# Patient Record
Sex: Female | Born: 1970
Health system: Southern US, Community
[De-identification: ages and names within clinical notes are randomized; demographics above are authoritative.]

## PROBLEM LIST (undated history)

## (undated) DIAGNOSIS — I10 Essential (primary) hypertension: Secondary | ICD-10-CM

## (undated) DIAGNOSIS — M940 Chondrocostal junction syndrome [Tietze]: Secondary | ICD-10-CM

## (undated) DIAGNOSIS — R079 Chest pain, unspecified: Secondary | ICD-10-CM

## (undated) HISTORY — DX: Chest pain, unspecified: R07.9

## (undated) HISTORY — DX: Chondrocostal junction syndrome (tietze): M94.0

---

## 1999-04-26 ENCOUNTER — Other Ambulatory Visit: Admission: RE | Admit: 1999-04-26 | Discharge: 1999-04-26 | Payer: Self-pay | Admitting: Gynecology

## 2001-05-28 ENCOUNTER — Emergency Department (HOSPITAL_COMMUNITY): Admission: EM | Admit: 2001-05-28 | Discharge: 2001-05-28 | Payer: Self-pay | Admitting: Emergency Medicine

## 2001-05-28 ENCOUNTER — Encounter: Payer: Self-pay | Admitting: Emergency Medicine

## 2004-07-07 ENCOUNTER — Emergency Department (HOSPITAL_COMMUNITY): Admission: EM | Admit: 2004-07-07 | Discharge: 2004-07-07 | Payer: Self-pay | Admitting: Emergency Medicine

## 2007-04-17 ENCOUNTER — Encounter (INDEPENDENT_AMBULATORY_CARE_PROVIDER_SITE_OTHER): Payer: Self-pay | Admitting: Obstetrics and Gynecology

## 2007-04-17 ENCOUNTER — Ambulatory Visit (HOSPITAL_COMMUNITY): Admission: RE | Admit: 2007-04-17 | Discharge: 2007-04-17 | Payer: Self-pay | Admitting: Obstetrics and Gynecology

## 2007-04-17 HISTORY — PX: DILATION AND CURETTAGE OF UTERUS: SHX78

## 2008-01-22 ENCOUNTER — Observation Stay (HOSPITAL_COMMUNITY): Admission: EM | Admit: 2008-01-22 | Discharge: 2008-01-23 | Payer: Self-pay | Admitting: Emergency Medicine

## 2008-01-22 ENCOUNTER — Encounter (INDEPENDENT_AMBULATORY_CARE_PROVIDER_SITE_OTHER): Payer: Self-pay | Admitting: General Surgery

## 2008-01-22 HISTORY — PX: LAPAROSCOPIC APPENDECTOMY: SHX408

## 2008-10-11 ENCOUNTER — Inpatient Hospital Stay (HOSPITAL_COMMUNITY): Admission: AD | Admit: 2008-10-11 | Discharge: 2008-10-11 | Payer: Self-pay | Admitting: Obstetrics and Gynecology

## 2008-12-01 ENCOUNTER — Inpatient Hospital Stay (HOSPITAL_COMMUNITY): Admission: AD | Admit: 2008-12-01 | Discharge: 2008-12-01 | Payer: Self-pay | Admitting: Obstetrics and Gynecology

## 2008-12-03 ENCOUNTER — Inpatient Hospital Stay (HOSPITAL_COMMUNITY): Admission: AD | Admit: 2008-12-03 | Discharge: 2008-12-03 | Payer: Self-pay | Admitting: Obstetrics and Gynecology

## 2009-01-23 ENCOUNTER — Inpatient Hospital Stay (HOSPITAL_COMMUNITY): Admission: AD | Admit: 2009-01-23 | Discharge: 2009-01-26 | Payer: Self-pay | Admitting: Obstetrics and Gynecology

## 2010-06-20 LAB — CBC
HCT: 32.6 % — ABNORMAL LOW (ref 36.0–46.0)
HCT: 33 % — ABNORMAL LOW (ref 36.0–46.0)
Hemoglobin: 10.2 g/dL — ABNORMAL LOW (ref 12.0–15.0)
Hemoglobin: 11 g/dL — ABNORMAL LOW (ref 12.0–15.0)
MCHC: 33.4 g/dL (ref 30.0–36.0)
MCV: 85.4 fL (ref 78.0–100.0)
Platelets: 268 10*3/uL (ref 150–400)
Platelets: 272 10*3/uL (ref 150–400)
RBC: 3.56 MIL/uL — ABNORMAL LOW (ref 3.87–5.11)
RBC: 3.86 MIL/uL — ABNORMAL LOW (ref 3.87–5.11)
RDW: 15.5 % (ref 11.5–15.5)
WBC: 10.5 10*3/uL (ref 4.0–10.5)
WBC: 11.3 10*3/uL — ABNORMAL HIGH (ref 4.0–10.5)

## 2010-06-20 LAB — COMPREHENSIVE METABOLIC PANEL
ALT: 15 U/L (ref 0–35)
AST: 22 U/L (ref 0–37)
Albumin: 2.9 g/dL — ABNORMAL LOW (ref 3.5–5.2)
Alkaline Phosphatase: 190 U/L — ABNORMAL HIGH (ref 39–117)
BUN: 3 mg/dL — ABNORMAL LOW (ref 6–23)
CO2: 19 mEq/L (ref 19–32)
Calcium: 8.7 mg/dL (ref 8.4–10.5)
Chloride: 100 mEq/L (ref 96–112)
Creatinine, Ser: 0.42 mg/dL (ref 0.4–1.2)
GFR calc Af Amer: >60 mL/min (ref >60)
GFR calc non Af Amer: >60 mL/min (ref >60)
Glucose, Bld: 89 mg/dL (ref 70–99)
Potassium: 3.7 mEq/L (ref 3.5–5.1)
Sodium: 129 mEq/L — ABNORMAL LOW (ref 135–145)
Total Bilirubin: 0.6 mg/dL (ref 0.3–1.2)
Total Protein: 6.4 g/dL (ref 6.0–8.3)

## 2010-06-20 LAB — RH IMMUNE GLOB WKUP(>/=20WKS)(NOT WOMEN'S HOSP): Fetal Screen: NEGATIVE

## 2010-06-20 LAB — RPR: RPR Ser Ql: NONREACTIVE

## 2010-06-20 LAB — LACTATE DEHYDROGENASE: LDH: 129 U/L (ref 94–250)

## 2010-06-20 LAB — URIC ACID: Uric Acid, Serum: 3.4 mg/dL (ref 2.4–7.0)

## 2010-06-22 LAB — CREATININE, URINE, 24 HOUR
Collection Interval-UCRE24: 24 hours
Creatinine, 24H Ur: 1754 mg/d (ref 700–1800)
Creatinine, Urine: 81.2 mg/dL

## 2010-06-22 LAB — COMPREHENSIVE METABOLIC PANEL
ALT: 15 U/L (ref 0–35)
Albumin: 3.1 g/dL — ABNORMAL LOW (ref 3.5–5.2)
Alkaline Phosphatase: 90 U/L (ref 39–117)
Potassium: 3.5 mEq/L (ref 3.5–5.1)
Sodium: 135 mEq/L (ref 135–145)
Total Protein: 6.6 g/dL (ref 6.0–8.3)

## 2010-06-22 LAB — CBC
Platelets: 306 10*3/uL (ref 150–400)
RDW: 14 % (ref 11.5–15.5)

## 2010-06-22 LAB — PROTEIN, URINE, 24 HOUR
Collection Interval-UPROT: 24 hours
Protein, Urine: 4 mg/dL

## 2010-06-22 LAB — URIC ACID: Uric Acid, Serum: 3.2 mg/dL (ref 2.4–7.0)

## 2010-06-24 LAB — RH IMMUNE GLOBULIN WORKUP (NOT WOMEN'S HOSP)

## 2010-07-31 NOTE — H&P (Signed)
NAME:  KARN, Debbie Shields             ACCOUNT NO.:  [id number]   MEDICAL RECORD NO.:  [id number]          PATIENT TYPE:  INP   LOCATION:  2550                         FACILITY:  [facility]   PHYSICIAN:  Ollen Gross. Vernell Morgans, M.D. DATE OF BIRTH:  Aug 10, 1970   DATE OF ADMISSION:  01/22/2008  DATE OF DISCHARGE:                              HISTORY & PHYSICAL   ADMITTING PHYSICIAN:  Ollen Gross. Vernell Morgans, M.D.   REASON FOR ADMISSION:  Acute appendicitis.   HISTORY OF PRESENT ILLNESS:  Debbie Shields is a 40 year old black female  who presents with right lower quadrant abdominal pain that began last  night.  She states the last time, it was approximately 8 p.m. last  night.  She has had some associated nausea and vomiting with her  abdominal pain.  She states that her pain started around her umbilicus  and as it progressed it moved to her right lower quadrant.  Otherwise at  this time, she has not had any fevers at home.  Due to this pain, she  presented to the emergency department this morning.  While here, she had  a CT scan, which showed acute appendicitis.  She does have a white blood  cell count of 14,500 with left shift and neutrophils 91%.  At this time,  we were called down to see the patient.   REVIEW OF SYSTEMS:  Please see HPI, otherwise all other systems are  negative.   FAMILY HISTORY:  Noncontributory.   PAST MEDICAL HISTORY:  None.   PAST SURGICAL HISTORY:  Status post D and C.   SOCIAL HISTORY:  I believe, the patient is single.  She is a nonsmoker  and a nondrinker.   ALLERGIES:  NKDA.   MEDICATIONS:  None.   PHYSICAL EXAMINATION:  GENERAL:  Debbie Shields is a 40 year old black  female who is currently lying in bed, in no acute distress.  VITAL SIGNS:  Temp 98.1, pulse 100, respirations 16, and blood pressure  128/78.  HEENT:  Eyes, sclerae noninjected.  Pupils were equal, round, and  reactive to light.  Ears, nose, mouth, and throat; ears and nose with no  obvious masses or  lesions.  No rhinorrhea.  Mouth is pink, albeit  somewhat dry.  Throat shows no exudate.  NECK:  Supple.  Trachea is midline.  No thyromegaly.  HEART:  Regular rhythm albeit somewhat tachycardic.  No murmurs,  gallops, or rubs are noted.  +2 carotid, radial, and pedal pulses  bilaterally.  LUNGS:  Clear to auscultation bilaterally.  No wheezes, rhonchi, or  rales noted.  Respiratory effort is nonlabored.  ABDOMEN:  Soft and very tender in the right lower quadrant.  She does  display some guarding.  Otherwise, she has hypoactive bowel sounds.  She  is nondistended, and mildly overweight.  Otherwise, she does not have  any obvious masses or hernias.  MUSCULOSKELETAL:  All 4 extremities are symmetrical with no cyanosis,  clubbing, or edema.  SKIN:  Warm and dry.  NEURO:  Cranial nerves II through XII appeared to be grossly intact.  PSYCH:  The patient is alert,  and oriented with an appropriate affect.   LABORATORY DATA AND DIAGNOSTICS:  White blood cell count is 14,500,  hemoglobin 13, hematocrit 38.8, platelet count is 321,000, and  neutrophil count at 91%.  Sodium 133, potassium 4.0, glucose 137, BUN 9,  creatinine 0.63.  LFTs are normal.  CT of the abdomen and pelvis shows  acute appendicitis.   IMPRESSION:  1. Acute appendicitis.  2. Dehydration.   PLAN:  At this time, we will plan on admitting the patient for a  laparoscopic appendectomy today.  She will be given various p.r.n.  medications for pain and nausea as well as Invanz.  First dose on-call  to the OR.  Otherwise, she will be kept n.p.o. and IV fluids will be  started and SCDs will be placed on call from the OR.  Dr. Carolynne Edouard has  discussed the risks and benefits of the procedure with the patient as  well as her parents who are currently in the room.  The patient is in  agreement and at this time wishes to proceed with surgical intervention.      Letha Cape, PA      Ollen Gross. Vernell Morgans, M.D.  Electronically  Signed    KEO/MEDQ  D:  01/22/2008  T:  01/22/2008  Job:  [id number]

## 2010-07-31 NOTE — Op Note (Signed)
NAME:  Debbie Shields, Debbie Shields             ACCOUNT NO.:  [id number]   MEDICAL RECORD NO.:  [id number]          PATIENT TYPE:  AMB   LOCATION:  SDC                           FACILITY:  [facility]   PHYSICIAN:  Hal Morales, M.D.DATE OF BIRTH:  [birth year]   DATE OF PROCEDURE:  04/17/2007  DATE OF DISCHARGE:                               OPERATIVE REPORT   PREOPERATIVE DIAGNOSIS:  Missed abortion at [redacted] weeks gestation.   POSTOPERATIVE DIAGNOSIS:  Missed abortion at [redacted] weeks gestation.   OPERATION:  Suction dilatation and evacuation.   SURGEON:  Hal Morales, M.D.   ANESTHESIA:  General mask.   ESTIMATED BLOOD LOSS:  Less than 10 mL.   COMPLICATIONS:  None.   FINDINGS:  The uterus sounded to 10 cm and contained a moderate amount  of products of conception.  The products of conception went to  pathology.   PROCEDURE:  The patient is taken to the operating room and after  appropriate identification, placed on the operating table.  After the  attainment of adequate general anesthesia, she was placed in the  lithotomy position.  The perineum and vagina were prepped with multiple  layers of Betadine and draped as a sterile field.  The bladder was  emptied with an in-and-out catheter in a sterile fashion.  The Graves  speculum was placed in the vagina and a paracervical block achieved with  a total of 10 mL of 2% Xylocaine in the 5 and 7 o'clock positions.  The  single-tooth tenaculum was placed on the anterior cervix.  The uterus  was sounded.  The cervix was dilated to accommodate a #7 suction  catheter and this was used to suction evacuate all quadrants of the  uterus.  Uterus was sharp curetted to ensure that all products of  conception had been removed.  All instruments were removed from the  vagina once hemostasis was noted to be adequate and the patient awakened  from general anesthesia and taken to the recovery room in satisfactory  condition, having tolerated the procedure well  with sponge and  instrument counts correct.   She was given Methergine 0.2 mg IM, Toradol 30 mg IV and 30 mg IM.   DISCHARGE INSTRUCTIONS:  Printed instructions from [facility] for  [facility].   DISCHARGE MEDICATIONS:  1. Ibuprofen 600 mg over-the-counter p.o. q.6 h p.r.n. pain,.  2. Methergine 0.2 mg p.o. q.6 h for eight doses.  The next dose is at      3:00 p.m. on April 17, 2007.  3. Doxycycline 100 mg p.o. b.i.d. for 5 days.   FOLLOW UP:  The patient is to follow-up in 2 weeks at [facility], a [facility] of [facility].  Blood type is B  negative.  The patient received RhoGAM before discharge.      Hal Morales, M.D.  Electronically Signed     VPH/MEDQ  D:  04/17/2007  T:  04/17/2007  Job:  [id number]

## 2010-07-31 NOTE — Op Note (Signed)
NAME:  Debbie Shields, Debbie Shields             ACCOUNT NO.:  [id number]   MEDICAL RECORD NO.:  [id number]          PATIENT TYPE:  INP   LOCATION:  2550                         FACILITY:  [facility]   PHYSICIAN:  Ollen Gross. Vernell Morgans, M.D. DATE OF BIRTH:  Oct 05, 1970   DATE OF PROCEDURE:  01/22/2008  DATE OF DISCHARGE:                               OPERATIVE REPORT   PREOPERATIVE DIAGNOSIS:  Acute appendicitis.   POSTOPERATIVE DIAGNOSIS:  Acute appendicitis.   PROCEDURE:  Laparoscopic appendectomy.   SURGEON:  Ollen Gross. Vernell Morgans, MD   ANESTHESIA:  General endotracheal.   PROCEDURE IN DETAIL:  After informed consent was obtained, the patient  was brought to the operating room and placed in a supine position on the  operating room table.  After adequate induction of general anesthesia,  the patient's abdomen was prepped with Betadine and draped in usual  sterile manner.  The area below the umbilicus was infiltrated with 0.25%  Marcaine.  A small incision was made with a 15 blade knife.  This  incision was carried down through the subcutaneous tissue bluntly with a  hemostat and [employer] retractors until the linea alba was identified.  The linea alba was incised with a 15 blade knife and each side was  grasped with Kocher clamps and elevated anteriorly.  The preperitoneal  space was then probed bluntly with a hemostat until the perineum was  opened and access was gained to the abdominal cavity.  A 0-Vicryl purse-  string stitch was placed in the fascia around the opening.  A Hasson  cannula was placed through the opening and anchored in place with  previously placed Vicryl purse-string stitch.  The abdomen was then  insufflated with carbon dioxide without difficulty.  The patient was  placed in Trendelenburg position and rotated with the right side up.  Next, the suprapubic region was infiltrated with 0.25% Marcaine.  A  small incision was made with a 15 blade knife and a 11-mm port was  placed bluntly  through this incision into the abdominal cavity under  direct vision.  A site was chosen between the two for placement of a 5-  mm port.  This area was infiltrated with 0.25% Marcaine.  A small stab  incision was made with a 15 blade knife and a 5-mm port was placed  bluntly through this incision into the abdominal cavity under direct  vision.  The laparoscope was then removed through the suprapubic port  and right lower quadrant was inspected.  The enlarged and inflamed  appendix was readily identified.  Using a Glassman grasper and harmonic  scalpel through the other two ports, the appendix was able to be  elevated.  The mesoappendix was taken down sharply with the harmonic  scalpel.  Once the base of the appendix at its junction with the cecum  was identified and cleared of any other tissue, a laparoscopic 45-mm  blue load 6-row stapler was placed through the Hasson cannula across the  base of the appendix at its junction with the cecum clamped and fired  thereby dividing the base of the appendix between the  staple lines.  Laparoscopic bag was inserted through the Hasson cannula and the  appendix was placed in the bag and the bag was sealed.  The area was  then irrigated with copious amounts of saline until the effluent was  clear.  The staple line was examined and was oozing a small amount of  blood.  This was controlled with some laparoscopic clips and at this  point everything looked hemostatic and the staple line was intact.  The  appendix and bag were then removed through with the port through the  infraumbilical incision without any difficulty.  The fascial defect was  closed with previously placed Vicryl purse-string stitch as well as  another figure-of-eight 0 Vicryl stitch.  The rest of ports were removed  under direct vision and were found to be hemostatic.  The gas was  allowed to escape.  The skin incisions were all  closed with interrupted 4-0 Monocryl subcuticular  stitches.  Dermabond  dressings were applied.  The patient tolerated the procedure well.  At  the end of the case, all needle, sponge, and instrument counts were  correct.  The patient was then awakened and taken to recovery in stable  condition.      Ollen Gross. Vernell Morgans, M.D.  Electronically Signed     PST/MEDQ  D:  01/22/2008  T:  01/22/2008  Job:  [id number]

## 2010-07-31 NOTE — H&P (Signed)
NAME:  Debbie Shields, SIVERTSON             ACCOUNT NO.:  [id number]   MEDICAL RECORD NO.:  [id number]          PATIENT TYPE:  AMB   LOCATION:  SDC                           FACILITY:  [facility]   PHYSICIAN:  Hal Morales, M.D.DATE OF BIRTH:  1970-12-04   DATE OF ADMISSION:  04/17/2007  DATE OF DISCHARGE:                              HISTORY & PHYSICAL   Debbie Shields is a 40 year old gravida 2, para 0-0-1-0 at 9 weeks, 4 days  by LMP but 6 weeks, 4 days by ultrasound with an intrauterine fetal  demise noted and an enlarged yolk sac noted.  Patient had presented for  her new OB visit on January 29 without any history of cramping,  bleeding, or any other issues.  She had her new GYN exam on March 30, 2007, at which time she had her Pap, and she was noted to be in the  first trimester of pregnancy at that time.  She was scheduled then for a  new OB workup today and an ultrasound for dating purposes.   HISTORY:  1. Patient was going to be advanced maternal age at the time of her      delivery.  2. History of mild hypertension.   LABS:  Blood type is B-, per [employer] card.  GC and Chlamydia  cultures were negative on January 12.  Patient's Pap smear done that  same day showed atypical cells of undetermined significance with  negative high resolution HPV and coccobacilli findings.  No other  prenatal labs were done on the patient's visit of January 29.   CURRENT OBSTETRICAL HISTORY:  Patient had a last menstrual period on  February 08, 2007.  This was a slightly short cycle.  That is why she  was scheduled for ultrasound today.  Her original [facility] by LMP was given  to be November 17, 2007.  However, on ultrasound on January 29, she was  noted to have a 6 week, 4 day fetal pole with no fetal heart tones.  Absent fetal heart rate was documented by M-mode, and her yolk sac was  enlarged.  Patient was then counseled regarding her options of  observation, medical management of missed AB,  or dilatation/evacuation.  Patient did wish to proceed with D&E.  Dr. Stefano Gaul was consulted, and  the decision was made to schedule her on April 17, 2007 with Dr.  Pennie Rushing at 3 p.m.   OBSTETRICAL HISTORY:  In 1997, patient had a first trimester termination  of pregnancy.   PAST MEDICAL HISTORY:  She is a previous condom and withdrawal user for  contraception.  In 1994, she had a questionable abnormal Pap.  Followup  was normal.  In 2005, she had a normal Pap at Dr. Ebony Hail office.  In  2006, she had a low grade SIL with mild dysplasia.  Colposcopy was  planned.  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  In March, 2007, she had a  Pap showing negative for intraepithelial lesion.  Trichomonas was  present.  This was treated.  She had another Pap in July, 2007 which  showed normal findings.  That was the last Pap that I have from that  office.   PAST SURGICAL HISTORY:  Previously noted termination of pregnancy in  1997.  Wisdom teeth removed in the past.  She reports the usual  childhood illnesses.  She did have Trichomonas several years ago.  She  also reports mild hypertension in the past.  She was on HCTZ but none  has been used in recent years.   FAMILY HISTORY:  Her sister had cervical cancer at age 74.   GENETIC HISTORY:  Remarkable for the patient being 36 at this time, but  she was going to be 37 at the time of delivery.   SOCIAL HISTORY:  Patient is engaged.  The father of the pregnancy was  involved and supportive.  His name is Raye Sorrow.  Patient is biracial.  She declares no specific religion.  She is college-educated.  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  She was going to be followed by the  certified nurse midwife service at [facility].   PHYSICAL EXAMINATION:  VITAL SIGNS:  Stable.  Patient is afebrile.  HEENT:  Within normal limits.  LUNGS:  Bilateral breath sounds are  clear.  HEART:  Regular rate and rhythm without murmur.  BREASTS:  Soft and nontender.  ABDOMEN:  Soft and nontender.  PELVIC:  Normal at her GYN appointment.  There was no evidence of  bleeding.  EXTREMITIES:  Deep tendon reflexes are 2+ without clonus.  There is  trace edema noted.   IMPRESSION:  1. A 6 week, 4 day fetal demise with a missed abortion.  2. B- blood type, per [employer].  3. Advanced maternal age.  4. History of mild hypertension but normal blood pressures today.   PLAN:  1. Admit to [facility] preoperatively on April 17, 2007 for D&E      by Dr. Pennie Rushing.  2. Routine preoperative orders.  3. Rophylac will be given secondary to B- status if antibody screen is      negative.  4. Support will be provided to the patient for her loss.  5. Pap will be reviewed subsequent to the patient's procedure.      Renaldo Reel Emilee Hero, C.N.M.      Hal Morales, M.D.  Electronically Signed    VLL/MEDQ  D:  04/16/2007  T:  04/16/2007  Job:  [id number]

## 2010-12-06 LAB — RH IMMUNE GLOBULIN WORKUP (NOT WOMEN'S HOSP)
ABO/RH(D): B NEG
Antibody Screen: NEGATIVE

## 2010-12-06 LAB — CBC
HCT: 33.9 — ABNORMAL LOW
Platelets: 318
WBC: 6.9

## 2010-12-06 LAB — ABO/RH: ABO/RH(D): B NEG

## 2010-12-19 LAB — COMPREHENSIVE METABOLIC PANEL
ALT: 42 — ABNORMAL HIGH
Albumin: 4.2
Alkaline Phosphatase: 56
Calcium: 9.3
Potassium: 4
Sodium: 133 — ABNORMAL LOW
Total Protein: 7.5

## 2010-12-19 LAB — DIFFERENTIAL
Basophils Relative: 0
Eosinophils Absolute: 0
Lymphs Abs: 0.9
Monocytes Absolute: 0.4
Monocytes Relative: 3
Neutro Abs: 13.2 — ABNORMAL HIGH
Neutrophils Relative %: 91 — ABNORMAL HIGH

## 2010-12-19 LAB — URINALYSIS, ROUTINE W REFLEX MICROSCOPIC
Bilirubin Urine: NEGATIVE
Glucose, UA: NEGATIVE
Hgb urine dipstick: NEGATIVE
Ketones, ur: 15 — AB
Specific Gravity, Urine: 1.028
pH: 6.5

## 2010-12-19 LAB — PROTIME-INR
INR: 1
Prothrombin Time: 12.9

## 2010-12-19 LAB — CBC
MCHC: 33.4
Platelets: 321
RDW: 14

## 2010-12-19 LAB — WET PREP, GENITAL
Trich, Wet Prep: NONE SEEN
Yeast Wet Prep HPF POC: NONE SEEN

## 2010-12-19 LAB — GC/CHLAMYDIA PROBE AMP, GENITAL
Chlamydia, DNA Probe: NEGATIVE
GC Probe Amp, Genital: NEGATIVE

## 2010-12-19 LAB — APTT: aPTT: 28

## 2012-02-17 ENCOUNTER — Encounter (HOSPITAL_COMMUNITY): Payer: Self-pay

## 2012-02-17 ENCOUNTER — Emergency Department (HOSPITAL_COMMUNITY)
Admission: EM | Admit: 2012-02-17 | Discharge: 2012-02-17 | Disposition: A | Discharge status: Home / Self Care | Payer: PRIVATE HEALTH INSURANCE | Source: Home / Self Care

## 2012-02-17 DIAGNOSIS — R0789 Other chest pain: Secondary | ICD-10-CM

## 2012-02-17 DIAGNOSIS — R071 Chest pain on breathing: Secondary | ICD-10-CM

## 2012-02-17 DIAGNOSIS — M940 Chondrocostal junction syndrome [Tietze]: Secondary | ICD-10-CM

## 2012-02-17 HISTORY — DX: Essential (primary) hypertension: I10

## 2012-02-17 MED ORDER — KETOROLAC TROMETHAMINE 60 MG/2ML IM SOLN
INTRAMUSCULAR | Status: AC
  Filled 2012-02-17: qty 2

## 2012-02-17 MED ORDER — KETOROLAC TROMETHAMINE 60 MG/2ML IM SOLN
60.0000 mg | Freq: Once | INTRAMUSCULAR | Status: AC
  Administered 2012-02-17: 60 mg via INTRAMUSCULAR

## 2012-02-17 MED ORDER — NAPROXEN 500 MG PO TBEC: 500.0000 mg | DELAYED_RELEASE_TABLET | Freq: Two times a day (BID) | ORAL | Status: DC

## 2012-02-17 NOTE — ED Provider Notes (Signed)
History     CSN: [id number]  Arrival date & time 02/17/12  1518   None     Chief Complaint  Patient presents with  . Chest Pain    (Consider location/radiation/quality/duration/timing/severity/associated sxs/prior treatment) HPI Comments: 41 year old obese female presents with chest pain since yesterday. The pain is located retro-left breast area as well as the left parasternal and upper costal margin. The pain is elicited by certain movement in body positions as well is taking a deep breath. She describes it as sharp, shooting as well as burning sensation. She denies heaviness, tightness, fullness, pressure, squeezing. No associated symptoms such as nausea, vomiting, diaphoresis, malaise or weakness. At rest in while and mobile there is no discomfort. She has no personal history of heart disease.   Past Medical History  Diagnosis Date  . Hypertension     History reviewed. No pertinent past surgical history.  History reviewed. No pertinent family history.  History  Substance Use Topics  . Smoking status: Never Smoker   . Smokeless tobacco: Not on file  . Alcohol Use: No    OB History    Grav Para Term Preterm Abortions TAB SAB Ect Mult Living                  Review of Systems  Constitutional: Negative for fever, activity change and fatigue.  HENT: Negative.   Respiratory: Negative for cough, shortness of breath and wheezing.   Cardiovascular: Positive for chest pain. Negative for palpitations.  Gastrointestinal: Negative.   Genitourinary: Negative.   Musculoskeletal: Negative.   Skin: Negative for color change, pallor and rash.  Neurological: Negative.   Psychiatric/Behavioral: The patient is nervous/anxious.     Allergies  Review of patient's allergies indicates no known allergies.  Home Medications   Current Outpatient Rx  Name  Route  Sig  Dispense  Refill  . NAPROXEN 500 MG PO TBEC   Oral   Take 1 tablet (500 mg total) by mouth 2 (two) times daily  with a meal.   20 tablet   0     BP 141/89  Pulse 87  Temp 97.9 F (36.6 C) (Oral)  Resp 20  SpO2 100%  LMP 02/16/2012  Physical Exam  Nursing note and vitals reviewed. Constitutional: She is oriented to person, place, and time. She appears well-developed and well-nourished. No distress.  HENT:  Head: Normocephalic and atraumatic.  Eyes: EOM are normal. Pupils are equal, round, and reactive to light.  Neck: Normal range of motion. Neck supple.  Cardiovascular: Normal rate, regular rhythm, normal heart sounds and intact distal pulses.   No murmur heard. Pulmonary/Chest: Effort normal and breath sounds normal. No respiratory distress. She has no wheezes. She has no rales. She exhibits tenderness.       Reproducible tenderness with manual pressure over the left sternal border and retro-left breast chest wall.  Musculoskeletal: She exhibits no edema and no tenderness.  Lymphadenopathy:    She has no cervical adenopathy.  Neurological: She is alert and oriented to person, place, and time. No cranial nerve deficit.  Skin: Skin is warm and dry.    ED Course  Procedures (including critical care time)  Labs Reviewed - No data to display No results found.   1. Costochondritis, acute   2. Chest wall pain       MDM  Reassurance Naprosyn EC 500 mg one twice a day with food when necessary pain Ice to sore areas This may last a few days to  a few weeks for any worsening new symptoms or problems recheck with your physician or may return  EKG normal sinus rhythm, no ischemic changes or ectopy.        Hayden Rasmussen, NP 02/17/12 1700  Hayden Rasmussen, NP 02/17/12 2041

## 2012-02-17 NOTE — ED Notes (Signed)
Asked by front staff to assess pt for chest pain... Pt c/o chest pain since yesterday... Pain increases w/activity... Denies: SOB, headaches, blurry vision, edema, fevers, vomiting, nauseas, diarrhea... Was dx w/HTN but not taking any meds for HTN... Pt is alert w/no signs of acute distress and asked to wait in the lobby since the wait is not long.

## 2012-02-17 NOTE — ED Notes (Signed)
C/o pain in left chest under breast since yesterday; pain comes and goes , worse this PM; father ( early 15's ) has just had a 4 vessel bypass, pt has hist of HBP. . Pain worse w palpation and w deep breaths

## 2012-02-18 NOTE — ED Provider Notes (Signed)
Medical screening examination/treatment/procedure(s) were performed by resident physician or non-physician practitioner and as supervising physician I was immediately available for consultation/collaboration.   Evalee Gerard DOUGLAS MD.    Meghna Hagmann D Trany Chernick, MD 02/18/12 1740 

## 2012-03-02 ENCOUNTER — Encounter: Payer: Self-pay | Admitting: *Deleted

## 2012-03-03 ENCOUNTER — Encounter: Payer: Self-pay | Admitting: *Deleted

## 2012-03-03 ENCOUNTER — Ambulatory Visit (INDEPENDENT_AMBULATORY_CARE_PROVIDER_SITE_OTHER): Payer: PRIVATE HEALTH INSURANCE | Admitting: Cardiovascular Disease

## 2012-03-03 VITALS — BP 155/90 | HR 107 | Ht 70.0 in | Wt 216.8 lb

## 2012-03-03 DIAGNOSIS — R079 Chest pain, unspecified: Secondary | ICD-10-CM | POA: Insufficient documentation

## 2012-03-03 DIAGNOSIS — R0989 Other specified symptoms and signs involving the circulatory and respiratory systems: Secondary | ICD-10-CM

## 2012-03-03 DIAGNOSIS — I10 Essential (primary) hypertension: Secondary | ICD-10-CM | POA: Insufficient documentation

## 2012-03-03 DIAGNOSIS — R0609 Other forms of dyspnea: Secondary | ICD-10-CM

## 2012-03-03 DIAGNOSIS — R0600 Dyspnea, unspecified: Secondary | ICD-10-CM

## 2012-03-03 MED ORDER — LOSARTAN POTASSIUM 50 MG PO TABS: 50.0000 mg | ORAL_TABLET | Freq: Every day | ORAL | Status: DC

## 2012-03-03 NOTE — Assessment & Plan Note (Signed)
Stop diuretic and start Cozaar  Low sodium diet and weight loss

## 2012-03-03 NOTE — Progress Notes (Signed)
Patient ID: Debbie Shields, female   DOB: May 18, 1970, 41 y.o.   MRN: [id number] 40 yo seen in ER 12/2  Presented with chest pain 12/1. The pain is located retro-left breast area as well as the left parasternal and upper costal margin. The pain is elicited by certain movement in body positions as well is taking a deep breath. She describes it as sharp, shooting as well as burning sensation. She denies heaviness, tightness, fullness, pressure, squeezing. No associated symptoms such as  nausea, vomiting, diaphoresis, malaise or weakness. At rest in while and mobile there is no discomfort. She has no personal history of heart disease.  ROS: Denies fever, malais, weight loss, blurry vision, decreased visual acuity, cough, sputum, SOB, hemoptysis, pleuritic pain, palpitaitons, heartburn, abdominal pain, melena, lower extremity edema, claudication, or rash.  All other systems reviewed and negative   General: Affect appropriate Healthy:  appears stated age HEENT: normal Neck supple with no adenopathy JVP normal no bruits no thyromegaly Lungs clear with no wheezing and good diaphragmatic motion Heart:  S1/S2 no murmur,rub, gallop or click PMI normal Abdomen: benighn, BS positve, no tenderness, no AAA no bruit.  No HSM or HJR Distal pulses intact with no bruits No edema Neuro non-focal Skin warm and dry No muscular weakness  Medications Current Outpatient Prescriptions  Medication Sig Dispense Refill  . hydrochlorothiazide (HYDRODIURIL) 25 MG tablet Take 25 mg by mouth daily.        Allergies Review of patient's allergies indicates no known allergies.  Family History: Family History  Problem Relation Age of Onset  . Cervical cancer Sister 63  . Heart Problems Father     4 vessel CABG    Social History: History   Social History  . Marital Status: Single    Spouse Name: N/A    Number of Children: N/A  . Years of Education: N/A   Occupational History  . Not on file.    Social History Main Topics  . Smoking status: Never Smoker   . Smokeless tobacco: Not on file  . Alcohol Use: No  . Drug Use:   . Sexually Active:    Other Topics Concern  . Not on file   Social History Narrative  . No narrative on file    Electrocardiogram:  12/2  SR 80 normal  Assessment and Plan

## 2012-03-03 NOTE — Assessment & Plan Note (Signed)
Atypical normal ECG  F/U ETT and echo

## 2012-03-03 NOTE — Patient Instructions (Addendum)
Your physician recommends that you schedule a follow-up appointment in: AS NEEDED Your physician has recommended you make the following change in your medication:  STOP HCTZ START  LOSARTAN 50 MG  EVERYDAY   Your physician has requested that you have an exercise tolerance test. For further information please visit [email or web address] Please also follow instruction sheet, as given. DX CHEST PAIN  Your physician has requested that you have an echocardiogram. Echocardiography is a painless test that uses sound waves to create images of your heart. It provides your doctor with information about the size and shape of your heart and how well your heart's chambers and valves are working. This procedure takes approximately one hour. There are no restrictions for this procedure. DX CHEST PAIN

## 2012-03-30 ENCOUNTER — Ambulatory Visit (HOSPITAL_COMMUNITY): Payer: PRIVATE HEALTH INSURANCE | Attending: Cardiovascular Disease

## 2012-03-30 DIAGNOSIS — R079 Chest pain, unspecified: Secondary | ICD-10-CM

## 2012-03-30 DIAGNOSIS — I369 Nonrheumatic tricuspid valve disorder, unspecified: Secondary | ICD-10-CM | POA: Insufficient documentation

## 2012-03-30 DIAGNOSIS — R0600 Dyspnea, unspecified: Secondary | ICD-10-CM

## 2012-03-30 DIAGNOSIS — I10 Essential (primary) hypertension: Secondary | ICD-10-CM | POA: Insufficient documentation

## 2012-03-30 DIAGNOSIS — R072 Precordial pain: Secondary | ICD-10-CM | POA: Insufficient documentation

## 2012-03-30 DIAGNOSIS — I059 Rheumatic mitral valve disease, unspecified: Secondary | ICD-10-CM | POA: Insufficient documentation

## 2012-03-30 NOTE — Progress Notes (Signed)
Echocardiogram performed.  

## 2012-04-02 ENCOUNTER — Encounter: Payer: PRIVATE HEALTH INSURANCE | Admitting: Physician Assistant

## 2012-04-27 ENCOUNTER — Ambulatory Visit (INDEPENDENT_AMBULATORY_CARE_PROVIDER_SITE_OTHER): Payer: PRIVATE HEALTH INSURANCE | Admitting: Physician Assistant

## 2012-04-27 DIAGNOSIS — R079 Chest pain, unspecified: Secondary | ICD-10-CM

## 2012-04-27 DIAGNOSIS — R0600 Dyspnea, unspecified: Secondary | ICD-10-CM

## 2012-04-27 NOTE — Progress Notes (Signed)
Exercise Treadmill Test  Pre-Exercise Testing Evaluation Rhythm: normal sinus  Rate: 102                 Test  Exercise Tolerance Test Ordering MD: Charlton Haws, MD  Interpreting MD: Tereso Newcomer, PA-C  Unique Test No: 1  Treadmill:  1  Indication for ETT: chest pain - rule out ischemia  Contraindication to ETT: No   Stress Modality: exercise - treadmill  Cardiac Imaging Performed: non   Protocol: standard Bruce - maximal  Max BP:  188/82  Max MPHR (bpm):  181 85% MPR (bpm):  154  MPHR obtained (bpm):  184 % MPHR obtained:  101%  Reached 85% MPHR (min:sec):  2:32 Total Exercise Time (min-sec):  7:01  Workload in METS:  8.4 Borg Scale: 17  Reason ETT Terminated:  desired heart rate attained    ST Segment Analysis At Rest: normal ST segments - no evidence of significant ST depression With Exercise: non-specific ST changes  Other Information Arrhythmia:  No Angina during ETT:  absent (0) Quality of ETT:  diagnostic  ETT Interpretation:  normal - no evidence of ischemia by ST analysis  Comments: Fair exercise tolerance. No chest pain. Normal BP response to exercise. No ST-T changes to suggest ischemia.  Good HR recovery post exercise.  Recommendations: Follow up with Dr. Charlton Haws as directed. Luna Glasgow, PA-C  9:58 AM 04/27/2012

## 2012-07-16 ENCOUNTER — Other Ambulatory Visit: Payer: Self-pay | Admitting: Family Medicine

## 2012-07-16 DIAGNOSIS — K449 Diaphragmatic hernia without obstruction or gangrene: Secondary | ICD-10-CM

## 2012-07-16 DIAGNOSIS — R1310 Dysphagia, unspecified: Secondary | ICD-10-CM

## 2012-07-23 ENCOUNTER — Other Ambulatory Visit: Payer: PRIVATE HEALTH INSURANCE

## 2012-07-31 ENCOUNTER — Ambulatory Visit
Admission: RE | Admit: 2012-07-31 | Discharge: 2012-07-31 | Disposition: A | Discharge status: Home / Self Care | Payer: PRIVATE HEALTH INSURANCE | Source: Ambulatory Visit | Attending: Family Medicine | Admitting: Family Medicine

## 2012-07-31 DIAGNOSIS — K449 Diaphragmatic hernia without obstruction or gangrene: Secondary | ICD-10-CM

## 2012-07-31 DIAGNOSIS — R1310 Dysphagia, unspecified: Secondary | ICD-10-CM

## 2012-11-26 ENCOUNTER — Telehealth: Payer: Self-pay | Admitting: Cardiovascular Disease

## 2012-11-26 NOTE — Telephone Encounter (Signed)
New message:  Pt is having neck discomfort and off and on chest discomfort x 2 wks.  Would like to be seen for this problem.  Please call her back as soon as possible

## 2012-11-26 NOTE — Telephone Encounter (Signed)
Patient called and stated that she has been having pains in her neck,back and chest, on and off for 1 to 2 weeks. She saw her PCP recently and was advised that she had some swelling in the lymph nodes due to possibly a past throat infection. Seen last year by Dr.Nishan with normal echo. Patient very concerned about the pains she is having on and off and would like to make sure that she rules out any cardiac issues. Advised to see Dr.Nishan 9/12 at 930am.

## 2012-11-27 ENCOUNTER — Encounter: Payer: Self-pay | Admitting: Cardiovascular Disease

## 2012-11-27 ENCOUNTER — Ambulatory Visit (INDEPENDENT_AMBULATORY_CARE_PROVIDER_SITE_OTHER): Payer: PRIVATE HEALTH INSURANCE | Admitting: Cardiovascular Disease

## 2012-11-27 VITALS — BP 160/90 | HR 96 | Wt 226.0 lb

## 2012-11-27 DIAGNOSIS — R079 Chest pain, unspecified: Secondary | ICD-10-CM

## 2012-11-27 DIAGNOSIS — I10 Essential (primary) hypertension: Secondary | ICD-10-CM

## 2012-11-27 NOTE — Assessment & Plan Note (Signed)
Well controlled.  Continue current medications and low sodium Dash type diet.    

## 2012-11-27 NOTE — Progress Notes (Signed)
Patient ID: Debbie Shields, female   DOB: 1970/07/10, 42 y.o.   MRN: [id number] Presented with chest pain 12/1. The pain is located retro-left breast area as well as the left parasternal and upper costal margin. The pain is elicited by certain movement in body positions as well is taking a deep breath. She describes it as sharp, shooting as well as burning sensation. She denies heaviness, tightness, fullness, pressure, squeezing. No associated symptoms such as  nausea, vomiting, diaphoresis, malaise or weakness. At rest in while and mobile there is no discomfort. She has no personal history of heart disease.  Seems to have some neck and sholder tension.  Some headaches.  Last saw dentist a year ago  F/U ETT 04/27/12 normal  F/U Echo 03/30/12 normal EF 55-60%    ROS: Denies fever, malais, weight loss, blurry vision, decreased visual acuity, cough, sputum, SOB, hemoptysis, pleuritic pain, palpitaitons, heartburn, abdominal pain, melena, lower extremity edema, claudication, or rash.  All other systems reviewed and negative  General: Affect appropriate Healthy:  appears stated age HEENT: normal Neck supple with no adenopathy JVP normal no bruits no thyromegaly Lungs clear with no wheezing and good diaphragmatic motion Heart:  S1/S2 no murmur, no rub, gallop or click PMI normal Abdomen: benighn, BS positve, no tenderness, no AAA no bruit.  No HSM or HJR Distal pulses intact with no bruits No edema Neuro non-focal Skin warm and dry No muscular weakness   Current Outpatient Prescriptions  Medication Sig Dispense Refill  . losartan (COZAAR) 50 MG tablet Take 1 tablet (50 mg total) by mouth daily.  90 tablet  3   No current facility-administered medications for this visit.    Allergies  Review of patient's allergies indicates no known allergies.  Electrocardiogram: SR rate 96 normal  11/27/2012   Assessment and Plan

## 2012-11-27 NOTE — Patient Instructions (Signed)
Your physician recommends that you schedule a follow-up appointment in: AS NEEDED  Your physician recommends that you continue on your current medications as directed. Please refer to the Current Medication list given to you today.  

## 2012-11-27 NOTE — Assessment & Plan Note (Signed)
Atypical Normal w/u No need for other testing  Suggested she see dentist for possible TMJ

## 2012-12-09 ENCOUNTER — Emergency Department (HOSPITAL_COMMUNITY)
Admission: EM | Admit: 2012-12-09 | Discharge: 2012-12-09 | Disposition: A | Discharge status: Home / Self Care | Payer: PRIVATE HEALTH INSURANCE | Attending: Emergency Medicine | Admitting: Emergency Medicine

## 2012-12-09 ENCOUNTER — Encounter (HOSPITAL_COMMUNITY): Payer: Self-pay | Admitting: Emergency Medicine

## 2012-12-09 DIAGNOSIS — I10 Essential (primary) hypertension: Secondary | ICD-10-CM | POA: Insufficient documentation

## 2012-12-09 DIAGNOSIS — Z8739 Personal history of other diseases of the musculoskeletal system and connective tissue: Secondary | ICD-10-CM | POA: Insufficient documentation

## 2012-12-09 DIAGNOSIS — R42 Dizziness and giddiness: Secondary | ICD-10-CM | POA: Insufficient documentation

## 2012-12-09 DIAGNOSIS — Z79899 Other long term (current) drug therapy: Secondary | ICD-10-CM | POA: Insufficient documentation

## 2012-12-09 DIAGNOSIS — Z3202 Encounter for pregnancy test, result negative: Secondary | ICD-10-CM | POA: Insufficient documentation

## 2012-12-09 DIAGNOSIS — R110 Nausea: Secondary | ICD-10-CM | POA: Insufficient documentation

## 2012-12-09 LAB — URINE MICROSCOPIC-ADD ON

## 2012-12-09 LAB — CBC WITH DIFFERENTIAL/PLATELET
Basophils Absolute: 0.1 10*3/uL (ref 0.0–0.1)
Eosinophils Relative: 2 % (ref 0–5)
HCT: 38.2 % (ref 36.0–46.0)
Hemoglobin: 12.7 g/dL (ref 12.0–15.0)
Lymphocytes Relative: 26 % (ref 12–46)
MCV: 84.9 fL (ref 78.0–100.0)
Monocytes Absolute: 0.4 10*3/uL (ref 0.1–1.0)
Monocytes Relative: 7 % (ref 3–12)
Neutro Abs: 3.9 10*3/uL (ref 1.7–7.7)
RDW: 13.7 % (ref 11.5–15.5)
WBC: 6.1 10*3/uL (ref 4.0–10.5)

## 2012-12-09 LAB — URINALYSIS, ROUTINE W REFLEX MICROSCOPIC
Bilirubin Urine: NEGATIVE
Glucose, UA: NEGATIVE mg/dL
Hgb urine dipstick: NEGATIVE
Ketones, ur: 15 mg/dL — AB
Protein, ur: NEGATIVE mg/dL
pH: 5.5 (ref 5.0–8.0)

## 2012-12-09 LAB — BASIC METABOLIC PANEL
BUN: 9 mg/dL (ref 6–23)
CO2: 22 mEq/L (ref 19–32)
Calcium: 9.1 mg/dL (ref 8.4–10.5)
Chloride: 103 mEq/L (ref 96–112)
Creatinine, Ser: 0.56 mg/dL (ref 0.50–1.10)
Glucose, Bld: 112 mg/dL — ABNORMAL HIGH (ref 70–99)

## 2012-12-09 MED ORDER — MECLIZINE HCL 25 MG PO TABS
25.0000 mg | ORAL_TABLET | Freq: Once | ORAL | Status: AC
  Administered 2012-12-09: 25 mg via ORAL
  Filled 2012-12-09: qty 1

## 2012-12-09 MED ORDER — SODIUM CHLORIDE 0.9 % IV BOLUS (SEPSIS)
1000.0000 mL | Freq: Once | INTRAVENOUS | Status: AC
  Administered 2012-12-09: 1000 mL via INTRAVENOUS

## 2012-12-09 MED ORDER — MECLIZINE HCL 25 MG PO TABS: 25.0000 mg | ORAL_TABLET | Freq: Three times a day (TID) | ORAL | Status: DC | PRN

## 2012-12-09 MED ORDER — ONDANSETRON HCL 4 MG/2ML IJ SOLN
4.0000 mg | Freq: Once | INTRAMUSCULAR | Status: AC
  Administered 2012-12-09: 4 mg via INTRAVENOUS
  Filled 2012-12-09: qty 2

## 2012-12-09 MED ORDER — ONDANSETRON 4 MG PO TBDP: 4.0000 mg | ORAL_TABLET | Freq: Three times a day (TID) | ORAL | Status: DC | PRN

## 2012-12-09 NOTE — ED Notes (Signed)
Family at bedside. 

## 2012-12-09 NOTE — ED Notes (Signed)
Patient is resting comfortably. 

## 2012-12-09 NOTE — ED Provider Notes (Signed)
CSN: [id number]     Arrival date & time 12/09/12  1308 History   First MD Initiated Contact with Patient 12/09/12 [phone]     Chief Complaint  Patient presents with  . Nausea  . Dizziness   (Consider location/radiation/quality/duration/timing/severity/associated sxs/prior Treatment) The history is provided by the patient and medical records.   Pt with PMH significant for HTN, costochrondritis, presents to the ED for sudden onset of lightheadedness at 0600 after sitting up in bed. Patient states the room was not spinning but she fell very off-balance. She attempted to lay back down but this did not improve her symptoms. There was no LOC.  Lightheadedness associated with nausea but no vomiting. Patient denies any sick contacts or recent illness. No fevers, sweats, or chills. Denies any chest pain, shortness of breath, abdominal pain, urinary symptoms, or vaginal discharge.  No prior episodes of similar sx.  No headaches, neck stiffness, visual disturbance, confusion, difficulty concentrating, or changes in speech.  Normal PO intake.  Past Medical History  Diagnosis Date  . Hypertension   . Costochondritis, acute   . Chest pain    Past Surgical History  Procedure Laterality Date  . Laparoscopic appendectomy  01/22/2008  . Dilation and curettage of uterus  04/17/2007    Missed abortion at [redacted] weeks gestation   Family History  Problem Relation Age of Onset  . Cervical cancer Sister 68  . Heart Problems Father     4 vessel CABG   History  Substance Use Topics  . Smoking status: Never Smoker   . Smokeless tobacco: Not on file  . Alcohol Use: No   OB History   Grav Para Term Preterm Abortions TAB SAB Ect Mult Living                 Review of Systems  Gastrointestinal: Positive for nausea.  Neurological: Positive for dizziness.  All other systems reviewed and are negative.    Allergies  Review of patient's allergies indicates no known allergies.  Home Medications   Current  Outpatient Rx  Name  Route  Sig  Dispense  Refill  . losartan (COZAAR) 50 MG tablet   Oral   Take 50 mg by mouth daily.          BP 152/98  Pulse 94  Temp(Src) 98.5 F (36.9 C) (Oral)  Resp 14  SpO2 97%  LMP 11/02/2012  Physical Exam  Nursing note and vitals reviewed. Constitutional: She is oriented to person, place, and time. She appears well-developed and well-nourished. No distress.  No dry heaving or active vomiting  HENT:  Head: Normocephalic and atraumatic.  Mouth/Throat: Oropharynx is clear and moist.  Eyes: Conjunctivae and EOM are normal. Pupils are equal, round, and reactive to light.  Neck: Normal range of motion and full passive range of motion without pain. Neck supple. No rigidity.  No meningeal signs  Cardiovascular: Normal rate, regular rhythm and normal heart sounds.   Pulmonary/Chest: Effort normal and breath sounds normal. No respiratory distress. She has no wheezes.  Abdominal: Soft. Bowel sounds are normal. There is no tenderness. There is no guarding.  Musculoskeletal: Normal range of motion.  Neurological: She is alert and oriented to person, place, and time. She has normal strength. She displays no tremor. No cranial nerve deficit or sensory deficit. She displays no seizure activity. Gait normal.  No focal neuro deficits appreciated; normal gait unassisted  Skin: Skin is warm and dry. She is not diaphoretic.  Psychiatric: She has a  normal mood and affect.    ED Course  Procedures (including critical care time)   Date: 12/09/2012  Rate: 92  Rhythm: normal sinus rhythm  QRS Axis: normal  Intervals: QT prolonged  ST/T Wave abnormalities: normal  Conduction Disutrbances:none  Narrative Interpretation:   Old EKG Reviewed: changes noted   Labs Review Labs Reviewed  BASIC METABOLIC PANEL - Abnormal; Notable for the following:    Glucose, Bld 112 (*)    All other components within normal limits  URINALYSIS, ROUTINE W REFLEX MICROSCOPIC -  Abnormal; Notable for the following:    APPearance CLOUDY (*)    Ketones, ur 15 (*)    Leukocytes, UA MODERATE (*)    All other components within normal limits  URINE MICROSCOPIC-ADD ON - Abnormal; Notable for the following:    Squamous Epithelial / LPF MANY (*)    Bacteria, UA MANY (*)    All other components within normal limits  URINE CULTURE  CBC WITH DIFFERENTIAL  TROPONIN I  POCT PREGNANCY, URINE   Imaging Review No results found.  MDM   1. Nausea   2. Lightheadedness     EKG NSR, QT slightly prolonged at 423 which is new when compared with previous.  Trop negative.  U/a contaminated and pt has no urinary sx at this time-- will wait for culture results.  Other labs largely WNL.  10:33 AM Nausea has resolve but still has some lightheadedness.  Pt able to ambulate to and from the bathroom unassisted with non-ataxic gait.  I have low suspicion for TIA, stroke, meningitis, or central vertigo at this time.  Pt afebrile, non-toxic appearing, NAD, VS stable- ok for discharge.  Instructed pt to FU with her PCP and discuss this ED visit.  Rx antivert and zofran.  Given strict return precautions and warning signs that would warrant ED return including vertiginous sx, severe headache, extremity weakness, visual disturbance, confusion, changes in speech, etc.  Pt acknowledged understanding and agreed with plan.  All questions and concerns addressed prior to d/c.  Discussed with Dr. Elesa Massed who agrees with assessment and plan.   Garlon Hatchet, PA-C 12/09/12 1536

## 2012-12-09 NOTE — ED Notes (Signed)
Sudden onset of dizziness at 6am-- "feels like I am spinning-- not the room" nauseated, no vomiting, denies inner ear problems or sinus infections recently

## 2012-12-09 NOTE — ED Notes (Signed)
Lisa, PA at the bedside.  

## 2012-12-09 NOTE — ED Notes (Signed)
To ED via private vehicle--with c/o sudden onset of dizziness at 6 am when sitting up in the bed- nauseated--no vomiting. States " I feel like I need to throw up, and would feel better if i could" alert/oriented x 4, w/d,

## 2012-12-09 NOTE — ED Provider Notes (Signed)
Medical screening examination/treatment/procedure(s) were performed by non-physician practitioner and as supervising physician I was immediately available for consultation/collaboration.  Layla Maw Artia Singley, DO 12/09/12 1557

## 2012-12-10 LAB — URINE CULTURE: Colony Count: >=100000

## 2013-03-29 ENCOUNTER — Other Ambulatory Visit: Payer: Self-pay | Admitting: Cardiovascular Disease

## 2013-05-12 ENCOUNTER — Ambulatory Visit: Payer: No Typology Code available for payment source | Admitting: Emergency Medicine

## 2013-05-12 VITALS — BP 134/86 | HR 113 | Temp 98.4°F | Resp 16 | Ht 67.0 in | Wt 223.2 lb

## 2013-05-12 DIAGNOSIS — N898 Other specified noninflammatory disorders of vagina: Secondary | ICD-10-CM

## 2013-05-12 DIAGNOSIS — Z209 Contact with and (suspected) exposure to unspecified communicable disease: Secondary | ICD-10-CM

## 2013-05-12 LAB — POCT URINALYSIS DIPSTICK
BILIRUBIN UA: NEGATIVE
Glucose, UA: NEGATIVE
Ketones, UA: NEGATIVE
NITRITE UA: NEGATIVE
PH UA: 7
RBC UA: NEGATIVE
Spec Grav, UA: 1.02
Urobilinogen, UA: 0.2

## 2013-05-12 LAB — POCT WET PREP WITH KOH
KOH Prep POC: NEGATIVE
Trichomonas, UA: NEGATIVE
Yeast Wet Prep HPF POC: NEGATIVE

## 2013-05-12 LAB — POCT UA - MICROSCOPIC ONLY
CASTS, UR, LPF, POC: NEGATIVE
Crystals, Ur, HPF, POC: NEGATIVE
Mucus, UA: POSITIVE
Yeast, UA: NEGATIVE

## 2013-05-12 LAB — POCT URINE PREGNANCY: PREG TEST UR: NEGATIVE

## 2013-05-12 MED ORDER — METRONIDAZOLE 500 MG PO TABS: 500.0000 mg | ORAL_TABLET | Freq: Two times a day (BID) | ORAL | Status: DC

## 2013-05-12 NOTE — Patient Instructions (Signed)

## 2013-05-12 NOTE — Progress Notes (Signed)
Subjective:    Patient ID: Debbie Shields, female    DOB: 09/29/1970, 43 y.o.   MRN: [id number] This chart was scribed for Darlyne Russian, MD by Anastasia Pall, ED Scribe. This patient was seen in room 10 and the patient's care was started at 12:48 PM.  Chief Complaint  Patient presents with  . Vaginitis    Vaginal iritation, some burning x 5 days  . Gynecologic Exam   HPI Debbie Shields is a 43 y.o. female Pt presents gyn exam. She reports vaginal irritaiton, burning over her vulva area for 5 days.   She states she goes to [facility]. She states it has been a while since her last pap smear. She denies any new sexual partners. She denies being on birth control medication. She states she has been sexually active, uses condoms. She states she was celibate for 4 years. She was last sexually active 3 weeks ago. She reports having yeast infection 10 years ago. She was treated for BV over 15 years ago. She denies any other gyn history.    PCP - Orie Fisherman, CMA  Patient Active Problem List   Diagnosis Date Noted  . Chest pain 03/03/2012  . HTN (hypertension) 03/03/2012   Past Medical History  Diagnosis Date  . Hypertension   . Costochondritis, acute   . Chest pain    Past Surgical History  Procedure Laterality Date  . Laparoscopic appendectomy  01/22/2008  . Dilation and curettage of uterus  04/17/2007    Missed abortion at [redacted] weeks gestation   No Known Allergies Prior to Admission medications   Medication Sig Start Date End Date Taking? Authorizing Provider  losartan (COZAAR) 50 MG tablet Take 50 mg by mouth daily. 03/03/12  Yes Josue Hector, MD  Multiple Vitamin (MULTIVITAMIN) tablet Take 1 tablet by mouth daily.   Yes Historical Provider, MD   Review of Systems  Constitutional: Negative for fever.  Genitourinary: Positive for vaginal pain (irritation, 5 days ago).  Skin: Negative for rash.      Objective:   Physical Exam CONSTITUTIONAL: Well  developed/well nourished HEAD: Normocephalic/atraumatic EYES: EOMI/PERRL ENMT: Mucous membranes moist NECK: supple no meningeal signs SPINE:entire spine nontender CV: S1/S2 noted, no murmurs/rubs/gallops noted LUNGS: Lungs CTAB, no apparent distress ABDOMEN: soft, nontender, no rebound or guarding GU there is a thick mucoid drainage in the vaginal vault. There is a whitish cream around the introitus. There are erosions around the cervix. The uterus is normal size there are no adnexal masses.  NEURO: Pt is awake/alert, moves all extremitiesx4 EXTREMITIES: pulses normal, full ROM SKIN: warm, color normal. Patient has multiple nevi on her trunk and extremities. PSYCH: no abnormalities of mood noted  BP 134/86  Pulse 113  Temp(Src) 98.4 F (36.9 C) (Oral)  Resp 16  Ht 5\' 7"  (1.702 m)  Wt 223 lb 3.2 oz (101.243 kg)  BMI 34.95 kg/m2  SpO2 99%  LMP 04/16/2013  Results for orders placed in visit on 05/12/13  POCT URINE PREGNANCY      Result Value Ref Range   Preg Test, Ur Negative    POCT URINALYSIS DIPSTICK      Result Value Ref Range   Color, UA yellow     Clarity, UA clear     Glucose, UA neg     Bilirubin, UA neg     Ketones, UA neg     Spec Grav, UA 1.020     Blood, UA neg  pH, UA 7.0     Protein, UA trace     Urobilinogen, UA 0.2     Nitrite, UA neg     Leukocytes, UA Trace    POCT UA - MICROSCOPIC ONLY      Result Value Ref Range   WBC, Ur, HPF, POC 1-3     RBC, urine, microscopic 5-7     Bacteria, U Microscopic 2+     Mucus, UA pos     Epithelial cells, urine per micros 3-4     Crystals, Ur, HPF, POC neg     Casts, Ur, LPF, POC neg     Yeast, UA neg    POCT WET PREP WITH KOH      Result Value Ref Range   Trichomonas, UA Negative     Clue Cells Wet Prep HPF POC tntc     Epithelial Wet Prep HPF POC tntc     Yeast Wet Prep HPF POC neg     Bacteria Wet Prep HPF POC 3+     RBC Wet Prep HPF POC 3-4     WBC Wet Prep HPF POC 11-20     KOH Prep POC Negative        Assessment & Plan:   Patient appears to have BV. Culture was done as well as a Pap 3 to include gonorrhea and chlamydia. Will treat with Flagyl 500 twice a day for 7 days.  **Disclaimer: This note was dictated with voice recognition software. Similar sounding words can inadvertently be transcribed and this note may contain transcription errors which may not have been corrected upon publication of note.**  I personally performed the services described in this documentation, which was scribed in my presence. The recorded information has been reviewed and is accurate.

## 2013-05-14 LAB — PAP IG, CT-NG, RFX HPV ASCU
Chlamydia Probe Amp: NEGATIVE
GC PROBE AMP: NEGATIVE

## 2013-05-14 LAB — URINE CULTURE
COLONY COUNT: NO GROWTH
ORGANISM ID, BACTERIA: NO GROWTH

## 2013-05-17 LAB — HUMAN PAPILLOMAVIRUS, HIGH RISK: HPV DNA HIGH RISK: NOT DETECTED

## 2013-07-04 ENCOUNTER — Other Ambulatory Visit: Payer: Self-pay | Admitting: Cardiovascular Disease

## 2013-10-19 ENCOUNTER — Other Ambulatory Visit: Payer: Self-pay | Admitting: Cardiovascular Disease

## 2013-12-30 ENCOUNTER — Other Ambulatory Visit (HOSPITAL_COMMUNITY)
Admission: RE | Admit: 2013-12-30 | Discharge: 2013-12-30 | Disposition: A | Discharge status: Home / Self Care | Payer: PRIVATE HEALTH INSURANCE | Source: Ambulatory Visit | Attending: Obstetrics & Gynecology | Admitting: Obstetrics & Gynecology

## 2013-12-30 ENCOUNTER — Other Ambulatory Visit: Payer: Self-pay | Admitting: Obstetrics & Gynecology

## 2013-12-30 DIAGNOSIS — Z01419 Encounter for gynecological examination (general) (routine) without abnormal findings: Secondary | ICD-10-CM | POA: Diagnosis not present

## 2013-12-30 DIAGNOSIS — Z1151 Encounter for screening for human papillomavirus (HPV): Secondary | ICD-10-CM | POA: Diagnosis present

## 2014-01-03 LAB — CYTOLOGY - PAP

## 2014-05-26 ENCOUNTER — Other Ambulatory Visit: Payer: Self-pay | Admitting: Cardiovascular Disease

## 2014-12-21 ENCOUNTER — Other Ambulatory Visit: Payer: Self-pay | Admitting: Cardiovascular Disease

## 2014-12-21 NOTE — Telephone Encounter (Signed)
Patient has not been seen in over two years, but is follow up prn. Please advise on refill. Thanks, MI

## 2014-12-21 NOTE — Telephone Encounter (Signed)
Can fill for one month and she needs f/u with primary or [place]

## 2015-02-08 ENCOUNTER — Other Ambulatory Visit: Payer: Self-pay

## 2015-02-08 DIAGNOSIS — Z1231 Encounter for screening mammogram for malignant neoplasm of breast: Secondary | ICD-10-CM

## 2015-03-01 ENCOUNTER — Ambulatory Visit
Admission: RE | Admit: 2015-03-01 | Discharge: 2015-03-01 | Disposition: A | Discharge status: Home / Self Care | Payer: 59 | Source: Ambulatory Visit

## 2015-03-01 DIAGNOSIS — Z1231 Encounter for screening mammogram for malignant neoplasm of breast: Secondary | ICD-10-CM

## 2015-03-30 ENCOUNTER — Other Ambulatory Visit: Payer: Self-pay | Admitting: Cardiovascular Disease

## 2015-03-31 NOTE — Telephone Encounter (Signed)
Tried to reach patient to inform her that she needs an appointment.

## 2015-03-31 NOTE — Telephone Encounter (Signed)
I have made several attempts to reach patient in regards to scheduling an appointment. She has not been seen since 2014. Please advise. Thanks, MI

## 2015-03-31 NOTE — Telephone Encounter (Signed)
On her last office visit she is to follow-up as needed. It is best for patient to have her PCP refill her medications if needed.

## 2015-04-11 ENCOUNTER — Other Ambulatory Visit: Payer: Self-pay | Admitting: Cardiovascular Disease

## 2016-01-15 ENCOUNTER — Other Ambulatory Visit: Payer: Self-pay | Admitting: Obstetrics & Gynecology

## 2016-01-15 DIAGNOSIS — Z1231 Encounter for screening mammogram for malignant neoplasm of breast: Secondary | ICD-10-CM

## 2016-03-01 ENCOUNTER — Ambulatory Visit
Admission: RE | Admit: 2016-03-01 | Discharge: 2016-03-01 | Disposition: A | Discharge status: Home / Self Care | Payer: 59 | Source: Ambulatory Visit | Attending: Obstetrics & Gynecology | Admitting: Obstetrics & Gynecology

## 2016-03-01 DIAGNOSIS — Z1231 Encounter for screening mammogram for malignant neoplasm of breast: Secondary | ICD-10-CM

## 2016-06-07 ENCOUNTER — Other Ambulatory Visit: Payer: Self-pay | Admitting: Obstetrics & Gynecology

## 2016-06-07 ENCOUNTER — Other Ambulatory Visit (HOSPITAL_COMMUNITY)
Admission: RE | Admit: 2016-06-07 | Discharge: 2016-06-07 | Disposition: A | Discharge status: Home / Self Care | Payer: 59 | Source: Ambulatory Visit | Attending: Obstetrics & Gynecology | Admitting: Obstetrics & Gynecology

## 2016-06-07 DIAGNOSIS — Z01411 Encounter for gynecological examination (general) (routine) with abnormal findings: Secondary | ICD-10-CM | POA: Insufficient documentation

## 2016-06-07 DIAGNOSIS — R87610 Atypical squamous cells of undetermined significance on cytologic smear of cervix (ASC-US): Secondary | ICD-10-CM | POA: Diagnosis not present

## 2016-06-07 DIAGNOSIS — N946 Dysmenorrhea, unspecified: Secondary | ICD-10-CM | POA: Diagnosis not present

## 2016-06-07 DIAGNOSIS — N760 Acute vaginitis: Secondary | ICD-10-CM | POA: Diagnosis not present

## 2016-06-07 DIAGNOSIS — Z1151 Encounter for screening for human papillomavirus (HPV): Secondary | ICD-10-CM | POA: Insufficient documentation

## 2016-06-07 DIAGNOSIS — I10 Essential (primary) hypertension: Secondary | ICD-10-CM | POA: Diagnosis not present

## 2016-06-13 LAB — CYTOLOGY - PAP
DIAGNOSIS: UNDETERMINED — AB
HPV (WINDOPATH): NOT DETECTED

## 2016-06-26 DIAGNOSIS — D235 Other benign neoplasm of skin of trunk: Secondary | ICD-10-CM | POA: Diagnosis not present

## 2016-06-26 DIAGNOSIS — L918 Other hypertrophic disorders of the skin: Secondary | ICD-10-CM | POA: Diagnosis not present

## 2016-06-26 DIAGNOSIS — L814 Other melanin hyperpigmentation: Secondary | ICD-10-CM | POA: Diagnosis not present

## 2016-07-05 DIAGNOSIS — Z0000 Encounter for general adult medical examination without abnormal findings: Secondary | ICD-10-CM | POA: Diagnosis not present

## 2016-07-05 DIAGNOSIS — I10 Essential (primary) hypertension: Secondary | ICD-10-CM | POA: Diagnosis not present

## 2016-07-05 DIAGNOSIS — Z23 Encounter for immunization: Secondary | ICD-10-CM | POA: Diagnosis not present

## 2016-07-26 DIAGNOSIS — Z13220 Encounter for screening for lipoid disorders: Secondary | ICD-10-CM | POA: Diagnosis not present

## 2016-07-26 DIAGNOSIS — Z0000 Encounter for general adult medical examination without abnormal findings: Secondary | ICD-10-CM | POA: Diagnosis not present

## 2016-10-29 DIAGNOSIS — E785 Hyperlipidemia, unspecified: Secondary | ICD-10-CM | POA: Diagnosis not present

## 2016-10-29 DIAGNOSIS — E559 Vitamin D deficiency, unspecified: Secondary | ICD-10-CM | POA: Diagnosis not present

## 2016-10-29 DIAGNOSIS — R739 Hyperglycemia, unspecified: Secondary | ICD-10-CM | POA: Diagnosis not present

## 2016-11-28 DIAGNOSIS — R350 Frequency of micturition: Secondary | ICD-10-CM | POA: Diagnosis not present

## 2017-01-30 ENCOUNTER — Other Ambulatory Visit: Payer: Self-pay | Admitting: Obstetrics & Gynecology

## 2017-01-30 DIAGNOSIS — Z1231 Encounter for screening mammogram for malignant neoplasm of breast: Secondary | ICD-10-CM

## 2017-03-03 ENCOUNTER — Ambulatory Visit
Admission: RE | Admit: 2017-03-03 | Discharge: 2017-03-03 | Disposition: A | Discharge status: Home / Self Care | Payer: 59 | Source: Ambulatory Visit | Attending: Obstetrics & Gynecology | Admitting: Obstetrics & Gynecology

## 2017-03-03 DIAGNOSIS — Z1231 Encounter for screening mammogram for malignant neoplasm of breast: Secondary | ICD-10-CM

## 2017-03-04 ENCOUNTER — Other Ambulatory Visit: Payer: Self-pay | Admitting: Obstetrics & Gynecology

## 2017-03-04 DIAGNOSIS — N6489 Other specified disorders of breast: Secondary | ICD-10-CM

## 2017-03-06 ENCOUNTER — Ambulatory Visit
Admission: RE | Admit: 2017-03-06 | Discharge: 2017-03-06 | Disposition: A | Discharge status: Home / Self Care | Payer: 59 | Source: Ambulatory Visit | Attending: Obstetrics & Gynecology | Admitting: Obstetrics & Gynecology

## 2017-03-06 ENCOUNTER — Other Ambulatory Visit: Payer: Self-pay | Admitting: Obstetrics & Gynecology

## 2017-03-06 DIAGNOSIS — N6489 Other specified disorders of breast: Secondary | ICD-10-CM

## 2017-03-06 DIAGNOSIS — R922 Inconclusive mammogram: Secondary | ICD-10-CM | POA: Diagnosis not present

## 2017-03-06 DIAGNOSIS — N644 Mastodynia: Secondary | ICD-10-CM | POA: Diagnosis not present

## 2017-05-28 DIAGNOSIS — R350 Frequency of micturition: Secondary | ICD-10-CM | POA: Diagnosis not present

## 2017-06-20 DIAGNOSIS — Z01419 Encounter for gynecological examination (general) (routine) without abnormal findings: Secondary | ICD-10-CM | POA: Diagnosis not present

## 2017-07-08 DIAGNOSIS — E785 Hyperlipidemia, unspecified: Secondary | ICD-10-CM | POA: Diagnosis not present

## 2017-07-08 DIAGNOSIS — E559 Vitamin D deficiency, unspecified: Secondary | ICD-10-CM | POA: Diagnosis not present

## 2017-07-08 DIAGNOSIS — Z0000 Encounter for general adult medical examination without abnormal findings: Secondary | ICD-10-CM | POA: Diagnosis not present

## 2017-07-08 DIAGNOSIS — N390 Urinary tract infection, site not specified: Secondary | ICD-10-CM | POA: Diagnosis not present

## 2017-07-08 DIAGNOSIS — R946 Abnormal results of thyroid function studies: Secondary | ICD-10-CM | POA: Diagnosis not present

## 2017-07-08 DIAGNOSIS — R7989 Other specified abnormal findings of blood chemistry: Secondary | ICD-10-CM | POA: Diagnosis not present

## 2017-07-25 DIAGNOSIS — K115 Sialolithiasis: Secondary | ICD-10-CM | POA: Diagnosis not present

## 2017-07-25 DIAGNOSIS — R590 Localized enlarged lymph nodes: Secondary | ICD-10-CM | POA: Diagnosis not present

## 2017-07-25 DIAGNOSIS — R6884 Jaw pain: Secondary | ICD-10-CM | POA: Diagnosis not present

## 2017-10-31 DIAGNOSIS — E559 Vitamin D deficiency, unspecified: Secondary | ICD-10-CM | POA: Diagnosis not present

## 2017-10-31 DIAGNOSIS — R946 Abnormal results of thyroid function studies: Secondary | ICD-10-CM | POA: Diagnosis not present

## 2017-10-31 DIAGNOSIS — D509 Iron deficiency anemia, unspecified: Secondary | ICD-10-CM | POA: Diagnosis not present

## 2018-02-25 DIAGNOSIS — I10 Essential (primary) hypertension: Secondary | ICD-10-CM | POA: Diagnosis not present

## 2018-03-13 IMAGING — MG 2D DIGITAL DIAGNOSTIC BILATERAL MAMMOGRAM WITH CAD AND ADJUNCT T
8 of 15 series · 8 of 35 positions shown · non-contrast
Comparison: Previous exam(s).

CLINICAL DATA: 46-year-old female with right breast pain
predominantly involving the outer right breast and extending towards
the nipple.

EXAM:
2D DIGITAL DIAGNOSTIC BILATERAL MAMMOGRAM WITH CAD AND ADJUNCT TOMO
BILATERAL BREAST ULTRASOUND

[L MLO synth-2D]
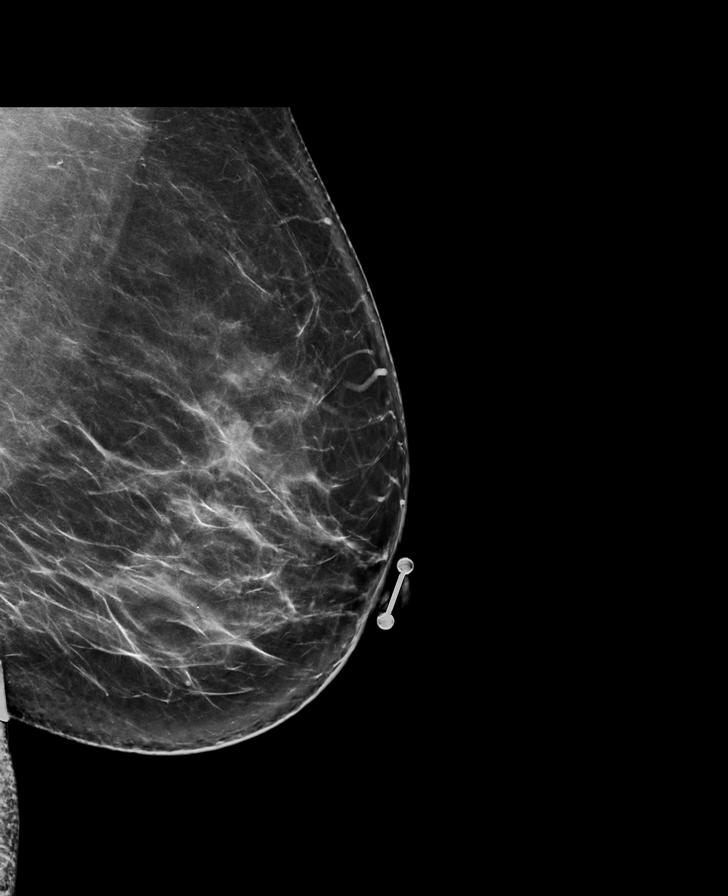

[L MLO]
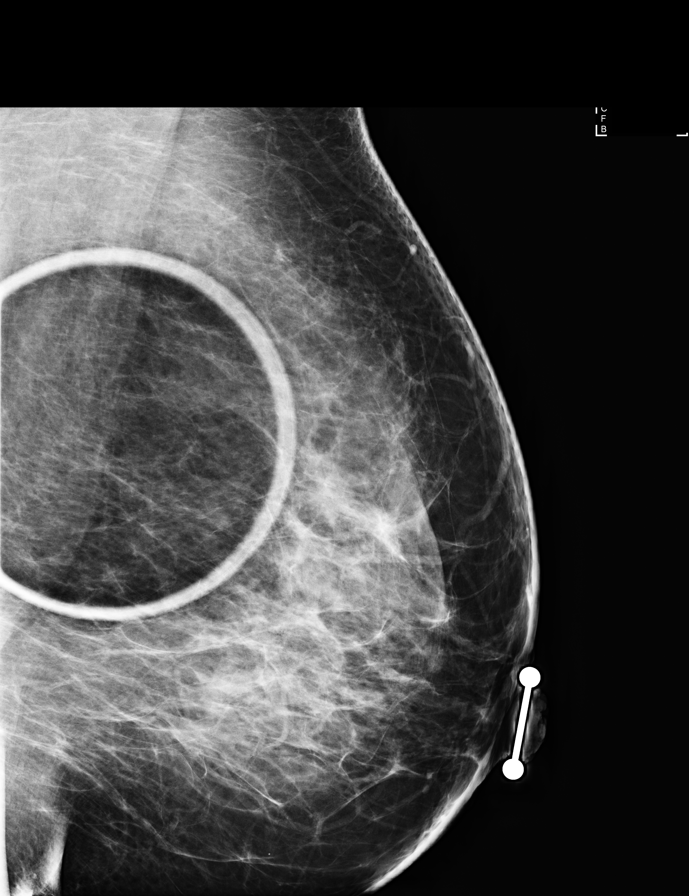

[L CC]
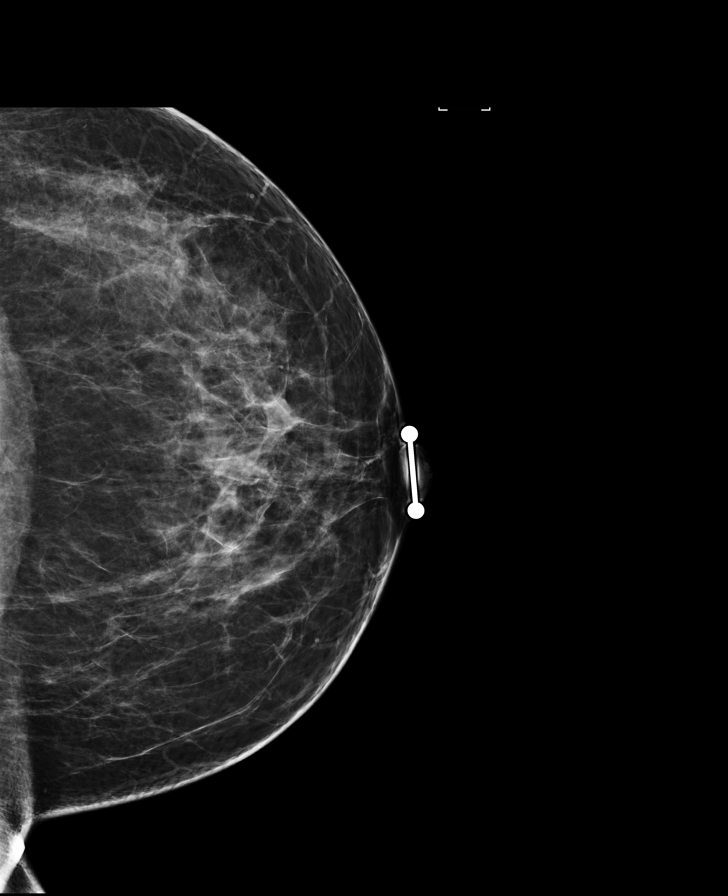

[R MLO]
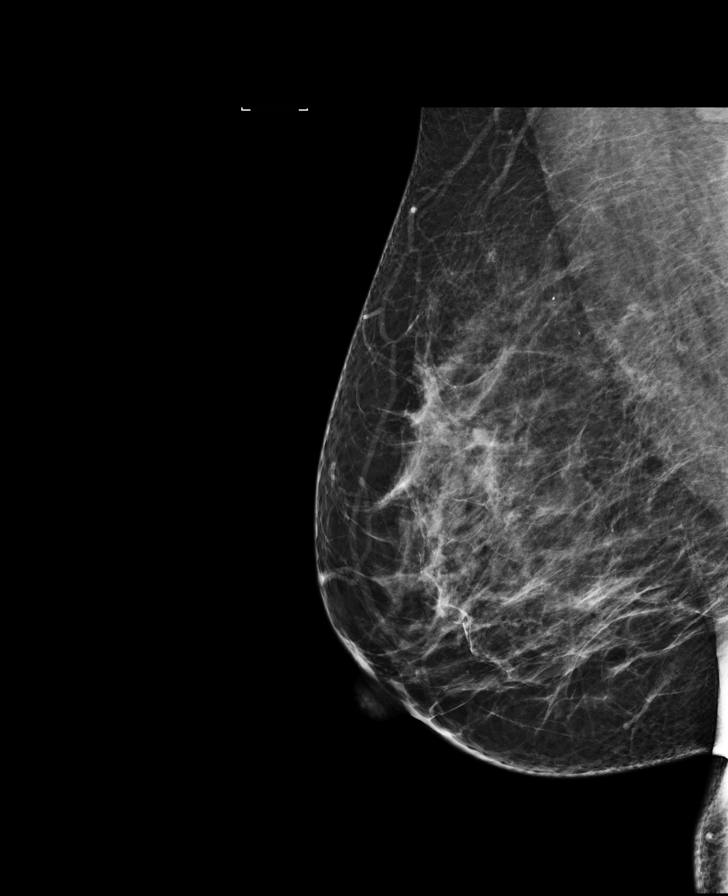

[R MLO synth-2D]
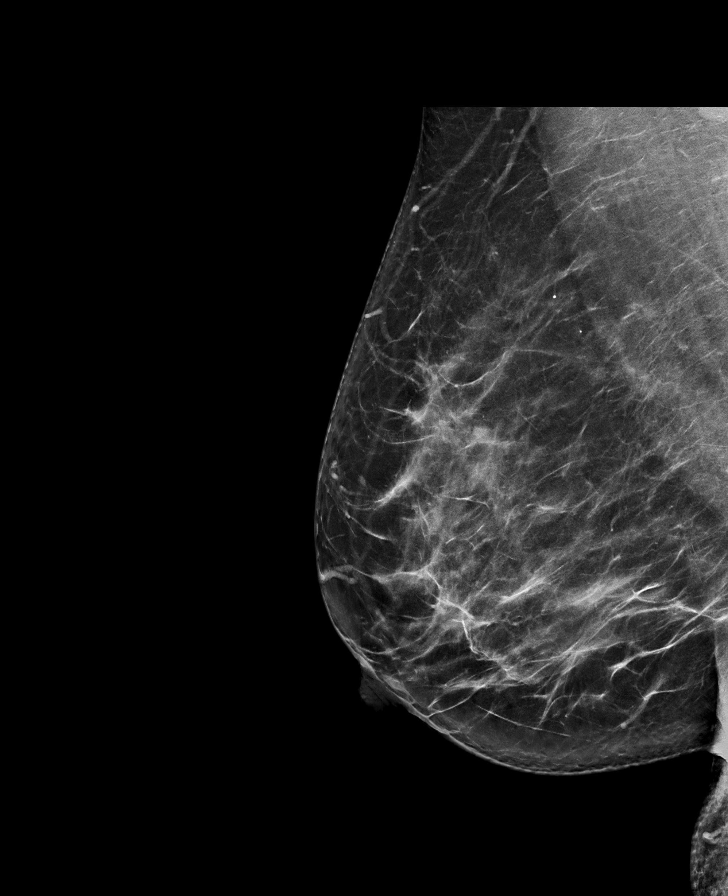

[R CC]
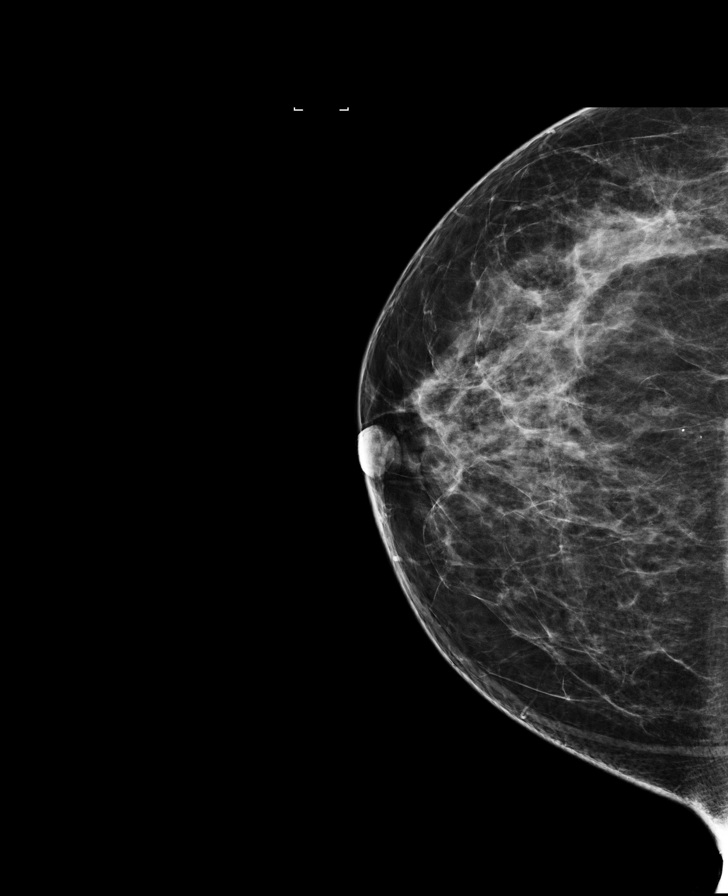

[L CC synth-2D]
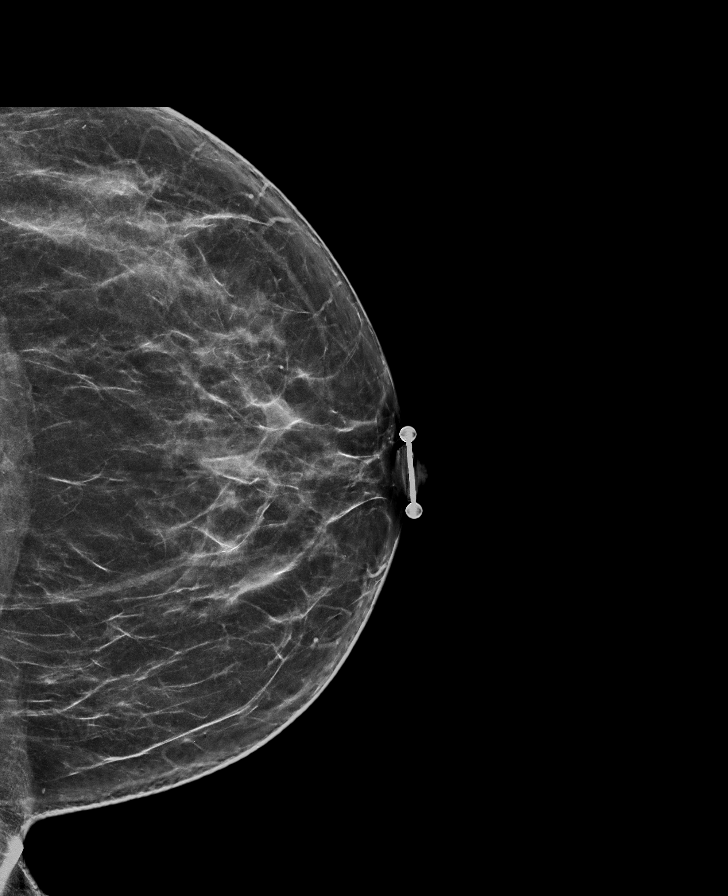

[R CC synth-2D]
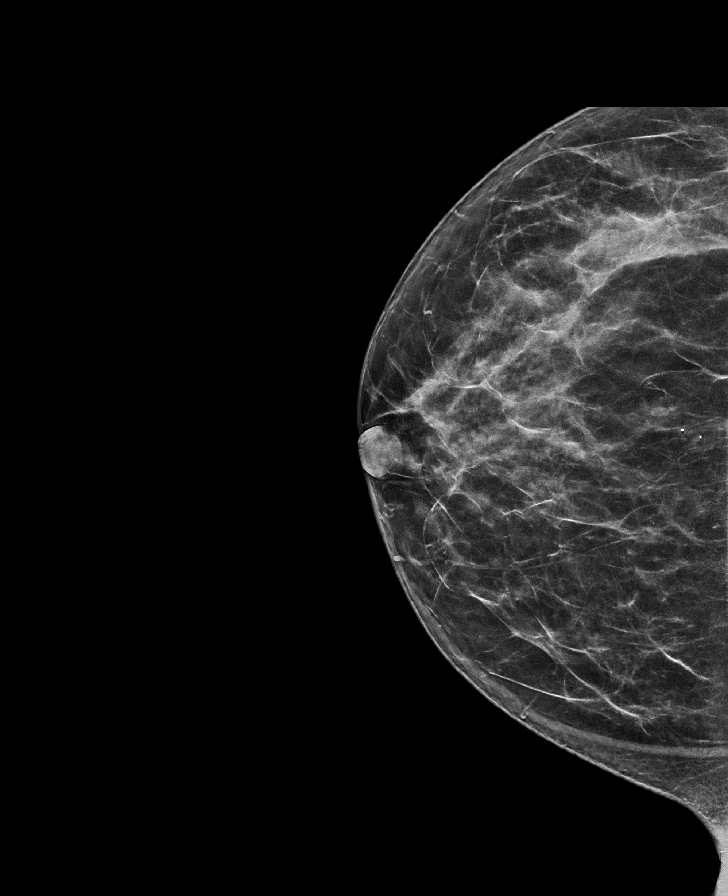

[8 of 35 positions shown; findings below may reference images not displayed]

ACR Breast Density Category c: The breast tissue is heterogeneously
dense, which may obscure small masses.
FINDINGS: No suspicious masses or calcifications seen in either breast. An
initially questioned asymmetry seen in the posterior left breast on
the MLO view only resolves on the additional spot compression MLO
view with the fibroglandular pattern in this location unchanged in
appearance.

Mammographic images were processed with CAD.

Physical examination of the outer right breast does not reveal any
palpable masses. A small healing bruise is present at the
approximate 10 o'clock position.

Targeted ultrasound of the outer and central right breast was
performed. No suspicious masses or abnormalities identified.

Targeted ultrasound of the central to upper left breast was
performed given dense fibroglandular tissue in this location. No
suspicious masses or abnormalities identified, only extremely dense
fibroglandular tissue seen.
IMPRESSION: No findings of malignancy in either breast.

RECOMMENDATION:
1. Recommend further management of the nonfocal pain predominantly
involving the outer right breast be based on clinical assessment.

2.  Screening mammogram in one year.(Code:D8-L-LPF)

I have discussed the findings and recommendations with the patient.
Results were also provided in writing at the conclusion of the
visit. If applicable, a reminder letter will be sent to the patient
regarding the next appointment.

BI-RADS CATEGORY  2: Benign.

## 2018-06-02 DIAGNOSIS — R0989 Other specified symptoms and signs involving the circulatory and respiratory systems: Secondary | ICD-10-CM | POA: Diagnosis not present

## 2018-06-02 DIAGNOSIS — J9801 Acute bronchospasm: Secondary | ICD-10-CM | POA: Diagnosis not present

## 2018-06-29 DIAGNOSIS — R0789 Other chest pain: Secondary | ICD-10-CM | POA: Diagnosis not present

## 2018-07-28 DIAGNOSIS — D509 Iron deficiency anemia, unspecified: Secondary | ICD-10-CM | POA: Diagnosis not present

## 2018-07-28 DIAGNOSIS — R079 Chest pain, unspecified: Secondary | ICD-10-CM | POA: Diagnosis not present

## 2018-07-28 DIAGNOSIS — I10 Essential (primary) hypertension: Secondary | ICD-10-CM | POA: Diagnosis not present

## 2018-07-28 DIAGNOSIS — E559 Vitamin D deficiency, unspecified: Secondary | ICD-10-CM | POA: Diagnosis not present

## 2018-07-31 ENCOUNTER — Telehealth: Payer: Self-pay | Admitting: *Deleted

## 2018-07-31 NOTE — Telephone Encounter (Signed)
REFERRAL SENT TO SCHEDULING AND NOTES ON FILE FROM DR. Aura Dials [phone]

## 2018-08-14 ENCOUNTER — Telehealth: Payer: Self-pay

## 2018-08-14 NOTE — Telephone Encounter (Signed)
Virtual Visit Pre-Appointment Phone Call  TELEPHONE CALL NOTE  Debbie Shields has been deemed a candidate for a follow-up tele-health visit to limit community exposure during the Covid-19 pandemic. I spoke with the patient via phone to ensure availability of phone/video source, confirm preferred email & phone number, and discuss instructions and expectations.  I reminded [name] to be prepared with any vital sign and/or heart rhythm information that could potentially be obtained via home monitoring, at the time of her visit. I reminded RAEJEAN SWINFORD to expect a phone call prior to her visit.  Patient agrees to consent below.  Cleon Gustin, RN 08/14/2018 5:09 PM  FULL LENGTH CONSENT FOR TELE-HEALTH VISIT   I hereby voluntarily request, consent and authorize [facility] and its employed or contracted physicians, physician assistants, nurse practitioners or other licensed health care professionals (the Practitioner), to provide me with telemedicine health care services (the "Services") as deemed necessary by the treating Practitioner. I acknowledge and consent to receive the Services by the Practitioner via telemedicine. I understand that the telemedicine visit will involve communicating with the Practitioner through live audiovisual communication technology and the disclosure of certain medical information by electronic transmission. I acknowledge that I have been given the opportunity to request an in-person assessment or other available alternative prior to the telemedicine visit and am voluntarily participating in the telemedicine visit.  I understand that I have the right to withhold or withdraw my consent to the use of telemedicine in the course of my care at any time, without affecting my right to future care or treatment, and that the Practitioner or I may terminate the telemedicine visit at any time. I understand that I have the right to inspect all information  obtained and/or recorded in the course of the telemedicine visit and may receive copies of available information for a reasonable fee.  I understand that some of the potential risks of receiving the Services via telemedicine include:  Marland Kitchen Delay or interruption in medical evaluation due to technological equipment failure or disruption; . Information transmitted may not be sufficient (e.g. poor resolution of images) to allow for appropriate medical decision making by the Practitioner; and/or  . In rare instances, security protocols could fail, causing a breach of personal health information.  Furthermore, I acknowledge that it is my responsibility to provide information about my medical history, conditions and care that is complete and accurate to the best of my ability. I acknowledge that Practitioner's advice, recommendations, and/or decision may be based on factors not within their control, such as incomplete or inaccurate data provided by me or distortions of diagnostic images or specimens that may result from electronic transmissions. I understand that the practice of medicine is not an exact science and that Practitioner makes no warranties or guarantees regarding treatment outcomes. I acknowledge that I will receive a copy of this consent concurrently upon execution via email to the email address I last provided but may also request a printed copy by calling the office of [facility].    I understand that my insurance will be billed for this visit.   I have read or had this consent read to me. . I understand the contents of this consent, which adequately explains the benefits and risks of the Services being provided via telemedicine.  . I have been provided ample opportunity to ask questions regarding this consent and the Services and have had my questions answered to my satisfaction. . I give  my informed consent for the services to be provided through the use of telemedicine in my medical care   By participating in this telemedicine visit I agree to the above.

## 2018-08-16 NOTE — Progress Notes (Signed)
Virtual Visit via Video Note   This visit type was conducted due to national recommendations for restrictions regarding the COVID-19 Pandemic (e.g. social distancing) in an effort to limit this patient's exposure and mitigate transmission in our community.  Due to her co-morbid illnesses, this patient is at least at moderate risk for complications without adequate follow up.  This format is felt to be most appropriate for this patient at this time.  All issues noted in this document were discussed and addressed.  A limited physical exam was performed with this format.  Please refer to the patient's chart for her consent to telehealth for [facility].   Date:  08/17/2018   ID:  Debbie Shields, DOB 14-Oct-1970, MRN [id number]  Patient Location: Home Provider Location: Home  PCP:  Orie Fisherman, CMA (Inactive)  Cardiologist:  No primary care provider on file. [place] Electrophysiologist:  None   Evaluation Performed:  Consultation - Debbie Shields was referred by Dr. Sheryn Bison for the evaluation of chest discomfort.  Chief Complaint:  Chest discomfort  History of Present Illness:    Debbie Shields is a 47 y.o. female with family h/o CAD.  She states she has not felt like herself since midMarch.  She felt congested.  She tried OTC meds. THen she had intense chest pressure.  She felt winded as well.  She was quite active at the gym before they closed.  THen, she was getting [facility] with talking.  It felt like something was sitting on her chest as well.  CXR was ok per her report.   She was given an inhaler as well.    She has continued to have a pressure intermittently that affects her a few days a week.  She feels some pressure after exercise.  It does not limit her during exercise.   Her BP has been a little higher of late, particularly the diastolic reading.  She has been working from home during the [place] pandemic.  The patient does not have symptoms  concerning for COVID-19 infection (fever, chills, cough, or new shortness of breath).    Past Medical History:  Diagnosis Date  . Chest pain   . Costochondritis, acute   . Hypertension    Past Surgical History:  Procedure Laterality Date  . DILATION AND CURETTAGE OF UTERUS  04/17/2007   Missed abortion at [redacted] weeks gestation  . LAPAROSCOPIC APPENDECTOMY  01/22/2008     Current Meds  Medication Sig  . albuterol (VENTOLIN HFA) 108 (90 Base) MCG/ACT inhaler INL 1 PUFF PO Q 4 H PRN  . amLODipine (NORVASC) 5 MG tablet TK 1 T PO QD  . FLOVENT HFA 44 MCG/ACT inhaler INL 1 PUFF PO BID  . metroNIDAZOLE (FLAGYL) 500 MG tablet Take 1 tablet (500 mg total) by mouth 2 (two) times daily.  . Multiple Vitamin (MULTIVITAMIN) tablet Take 1 tablet by mouth daily.  Marland Kitchen omeprazole (PRILOSEC) 40 MG capsule TK 1 C PO QD 30 MINUTES BEFORE MORNING MEAL  . trimethoprim (TRIMPEX) 100 MG tablet TK 1 T PO QD  . Vitamin D, Ergocalciferol, (DRISDOL) 1.25 MG (50000 UT) CAPS capsule TK 1 C PO TWICE A WEEK     Allergies:   Patient has no known allergies.   Social History   Tobacco Use  . Smoking status: Never Smoker  . Smokeless tobacco: Never Used  Substance Use Topics  . Alcohol use: No  . Drug use: No     Family Hx: The patient's  family history includes Cancer in her maternal grandmother; Cervical cancer (age of onset: 40) in her sister; Heart Problems in her father.  ROS:   Please see the history of present illness.    Chest pressure All other systems reviewed and are negative.   Prior CV studies:   The following studies were reviewed today: 2014 stress test   Labs/Other Tests and Data Reviewed:    EKG:  An ECG dated 07/2018 was personally reviewed today and demonstrated:  NSR, PVC  Recent Labs: No results found for requested labs within last 8760 hours.   Recent Lipid Panel No results found for: CHOL, TRIG, HDL, CHOLHDL, LDLCALC, LDLDIRECT  Wt Readings from Last 3 Encounters:  08/17/18  217 lb (98.4 kg)  05/12/13 223 lb 3.2 oz (101.2 kg)  11/27/12 226 lb (102.5 kg)     Objective:    Vital Signs:  BP (!) 139/98   Pulse 83   Ht 5\' 7"  (1.702 m)   Wt 217 lb (98.4 kg)   BMI 33.99 kg/m    VITAL SIGNS:  reviewed GEN:  no acute distress RESPIRATORY:  normal respiratory effort, symmetric expansion PSYCH:  normal affect exam limited by video format  ASSESSMENT & PLAN:    1. Chest discomfort: Cardiac CT to eval for CAD. Not totally typical chest pain for cardiac disease.  2. FAmily h/o CAD: Continue preventive therapy.  3. HTN: The current medical regimen is effective;  continue present plan and medications. 4. GERD: PPI started.  COVID-19 Education: The signs and symptoms of COVID-19 were discussed with the patient and how to seek care for testing (follow up with PCP or arrange E-visit).  The importance of social distancing was discussed today.  Time:   Today, I have spent 30 minutes with the patient with telehealth technology discussing the above problems.     Medication Adjustments/Labs and Tests Ordered: Current medicines are reviewed at length with the patient today.  Concerns regarding medicines are outlined above.   Tests Ordered: No orders of the defined types were placed in this encounter.   Medication Changes: No orders of the defined types were placed in this encounter.   Disposition:  Follow up for coronary CT  Signed, Larae Grooms, MD  08/17/2018 11:00 AM    [place]

## 2018-08-17 ENCOUNTER — Telehealth (INDEPENDENT_AMBULATORY_CARE_PROVIDER_SITE_OTHER): Payer: 59 | Admitting: Interventional Cardiology

## 2018-08-17 ENCOUNTER — Encounter: Payer: Self-pay | Admitting: Interventional Cardiology

## 2018-08-17 ENCOUNTER — Other Ambulatory Visit: Payer: Self-pay

## 2018-08-17 VITALS — BP 139/98 | HR 83 | Ht 67.0 in | Wt 217.0 lb

## 2018-08-17 DIAGNOSIS — I10 Essential (primary) hypertension: Secondary | ICD-10-CM | POA: Diagnosis not present

## 2018-08-17 DIAGNOSIS — K219 Gastro-esophageal reflux disease without esophagitis: Secondary | ICD-10-CM | POA: Diagnosis not present

## 2018-08-17 DIAGNOSIS — Z8249 Family history of ischemic heart disease and other diseases of the circulatory system: Secondary | ICD-10-CM

## 2018-08-17 DIAGNOSIS — R072 Precordial pain: Secondary | ICD-10-CM | POA: Diagnosis not present

## 2018-08-17 MED ORDER — METOPROLOL TARTRATE 50 MG PO TABS: ORAL_TABLET | ORAL | 0 refills | Status: AC

## 2018-08-17 NOTE — Patient Instructions (Signed)
Medication Instructions:  Your physician recommends that you continue on your current medications as directed. Please refer to the Current Medication list given to you today.  If you need a refill on your cardiac medications before your next appointment, please call your pharmacy.   Lab work: None Ordered  If you have labs (blood work) drawn today and your tests are completely normal, you will receive your results only by: Marland Kitchen MyChart Message (if you have MyChart) OR . A paper copy in the mail If you have any lab test that is abnormal or we need to change your treatment, we will call you to review the results.  Testing/Procedures: Your physician has requested that you have cardiac CT. Cardiac computed tomography (CT) is a painless test that uses an x-ray machine to take clear, detailed pictures of your heart. For further information please visit [email or web address]. Please follow instruction sheet as given.  Follow-Up: . Based on test results  Any Other Special Instructions Will Be Listed Below (If Applicable).  CARDIAC CT INSTRUCTIONS  Please arrive at the [facility] main entrance of [facility] on ___ at ___ (30-45 minutes prior to test start time)  [facility], [place] [address] [phone]  Proceed to the [facility] Radiology Department (First Floor).  Please follow these instructions carefully (unless otherwise directed):  On the Night Before the Test: . Be sure to Drink plenty of water. . Do not consume any caffeinated/decaffeinated beverages or chocolate 12 hours prior to your test. . Do not take any antihistamines 12 hours prior to your test.   On the Day of the Test: . Drink plenty of water. Do not drink any water within one hour of the test. . Do not eat any food 4 hours prior to the test. . You may take your regular medications prior to the test.  . Take metoprolol (Lopressor) two hours prior to test.      After the  Test: . Drink plenty of water. . After receiving IV contrast, you may experience a mild flushed feeling. This is normal. . On occasion, you may experience a mild rash up to 24 hours after the test. This is not dangerous. If this occurs, you can take Benadryl 25 mg and increase your fluid intake. . If you experience trouble breathing, this can be serious. If it is severe call 911 IMMEDIATELY. If it is mild, please call our office.

## 2018-08-17 NOTE — Addendum Note (Signed)
Addended by: Drue Novel I on: 08/17/2018 11:25 AM   Modules accepted: Orders

## 2018-08-27 ENCOUNTER — Telehealth (HOSPITAL_COMMUNITY): Payer: Self-pay | Admitting: Emergency Medicine

## 2018-08-27 NOTE — Telephone Encounter (Signed)
Reaching out to patient to offer assistance regarding upcoming cardiac imaging study; pt verbalizes understanding of appt date/time, parking situation and where to check in, pre-test NPO status and medications ordered, and verified current allergies; name and call back number provided for further questions should they arise Kristalynn Coddington RN Navigator Cardiac Imaging [facility] [phone] office [phone] cell  Pt denies covid symptoms, verbalized understanding of visitor policy. 

## 2018-08-28 ENCOUNTER — Ambulatory Visit (HOSPITAL_COMMUNITY)
Admission: RE | Admit: 2018-08-28 | Discharge: 2018-08-28 | Disposition: A | Discharge status: Home / Self Care | Payer: 59 | Source: Ambulatory Visit | Attending: Interventional Cardiology | Admitting: Interventional Cardiology

## 2018-08-28 ENCOUNTER — Other Ambulatory Visit: Payer: Self-pay

## 2018-08-28 ENCOUNTER — Ambulatory Visit (HOSPITAL_COMMUNITY): Payer: 59

## 2018-08-28 DIAGNOSIS — Z8249 Family history of ischemic heart disease and other diseases of the circulatory system: Secondary | ICD-10-CM

## 2018-08-28 DIAGNOSIS — R072 Precordial pain: Secondary | ICD-10-CM

## 2018-08-28 DIAGNOSIS — K219 Gastro-esophageal reflux disease without esophagitis: Secondary | ICD-10-CM

## 2018-08-28 DIAGNOSIS — I10 Essential (primary) hypertension: Secondary | ICD-10-CM | POA: Diagnosis present

## 2018-08-28 MED ORDER — METOPROLOL TARTRATE 5 MG/5ML IV SOLN
5.0000 mg | INTRAVENOUS | Status: DC | PRN
  Administered 2018-08-28: 5 mg via INTRAVENOUS
  Filled 2018-08-28: qty 5

## 2018-08-28 MED ORDER — METOPROLOL TARTRATE 5 MG/5ML IV SOLN
INTRAVENOUS | Status: AC
  Filled 2018-08-28: qty 10

## 2018-08-28 MED ORDER — NITROGLYCERIN 0.4 MG SL SUBL
0.8000 mg | SUBLINGUAL_TABLET | Freq: Once | SUBLINGUAL | Status: AC
  Administered 2018-08-28: 0.8 mg via SUBLINGUAL
  Filled 2018-08-28: qty 25

## 2018-08-28 MED ORDER — IOHEXOL 300 MG/ML  SOLN
100.0000 mL | Freq: Once | INTRAMUSCULAR | Status: AC | PRN
  Administered 2018-08-28: 100 mL via INTRAVENOUS

## 2018-08-28 MED ORDER — NITROGLYCERIN 0.4 MG SL SUBL
SUBLINGUAL_TABLET | SUBLINGUAL | Status: AC
  Filled 2018-08-28: qty 2

## 2019-05-28 ENCOUNTER — Ambulatory Visit: Payer: 59 | Attending: Internal Medicine

## 2019-05-28 DIAGNOSIS — Z23 Encounter for immunization: Secondary | ICD-10-CM

## 2019-05-28 NOTE — Progress Notes (Signed)
   Covid-19 Vaccination Clinic  Name:  Debbie Shields    MRN: [id number] DOB: [birth year]  05/28/2019  Debbie Shields was observed post Covid-19 immunization for 15 minutes without incident. She was provided with Vaccine Information Sheet and instruction to access the V-Safe system.   Debbie Shields was instructed to call 911 with any severe reactions post vaccine: Marland Kitchen Difficulty breathing  . Swelling of face and throat  . A fast heartbeat  . A bad rash all over body  . Dizziness and weakness   Immunizations Administered    Name Date Dose VIS Date Route   Pfizer COVID-19 Vaccine 05/28/2019  8:14 AM 0.3 mL 02/26/2019 Intramuscular   Manufacturer: [place]   Lot: [id number]   [place]: [id number]

## 2019-06-22 ENCOUNTER — Ambulatory Visit: Payer: 59 | Attending: Internal Medicine

## 2019-06-22 DIAGNOSIS — Z23 Encounter for immunization: Secondary | ICD-10-CM

## 2019-06-22 NOTE — Progress Notes (Signed)
   Covid-19 Vaccination Clinic  Name:  Debbie Shields    MRN: [id number] DOB: [birth year]  06/22/2019  Debbie Shields was observed post Covid-19 immunization for 15 minutes without incident. She was provided with Vaccine Information Sheet and instruction to access the V-Safe system.   Debbie Shields was instructed to call 911 with any severe reactions post vaccine: Marland Kitchen Difficulty breathing  . Swelling of face and throat  . A fast heartbeat  . A bad rash all over body  . Dizziness and weakness   Immunizations Administered    Name Date Dose VIS Date Route   Pfizer COVID-19 Vaccine 06/22/2019  8:48 AM 0.3 mL 02/26/2019 Intramuscular   Manufacturer: [place]   Lot: [id number]   [place]: [id number]

## 2019-09-04 IMAGING — CT CT HEAR MORPH WITH CTA COR WITH SCORE WITH CA WITH CONTRAST AND
4 of 7 series · 8 of 20 positions shown, 9 images · IV contrast (APPLIED)
Comparison: None.
COMPARISON: None.

Addendum:
EXAM:
OVER-READ INTERPRETATION  CT CHEST

The following report is an over-read performed by radiologist Dr.
Lizabeth Ken [REDACTED] on 08/28/2018. This
over-read does not include interpretation of cardiac or coronary
anatomy or pathology. The coronary CTA interpretation by the
cardiologist is attached.
CLINICAL DATA: Chest pain
Cardiac CTA
MEDICATIONS:
Sub lingual nitro. 4mg x 2
TECHNIQUE: The patient was scanned on a Siemens [REDACTED]ice scanner. Gantry
rotation speed was 250 msecs. Collimation was 0.6 mm. A 100 kV
prospective scan was triggered in the ascending thoracic aorta at
35-75% of the R-R interval. Average HR during the scan was 60 bpm.
The 3D data set was interpreted on a dedicated work station using
MPR, MIP and VRT modes. A total of 80cc of contrast was used.

[Series 6: best diast 77 % · axial · 0.29mm/px · z∈[+1301,+1348]mm · 2 of 356 slices shown, 3 images]
[im 119/356  vessel]
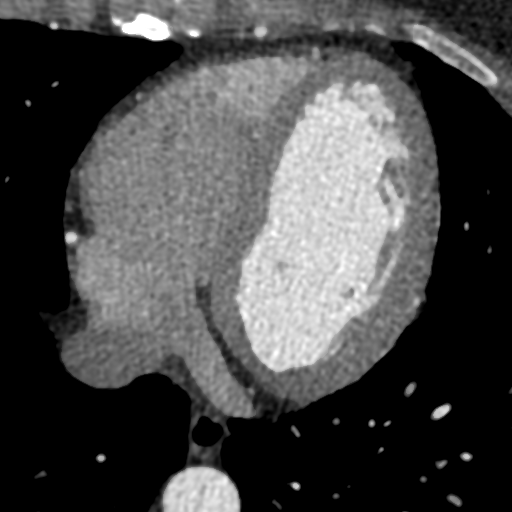
[im 119/356  lung]
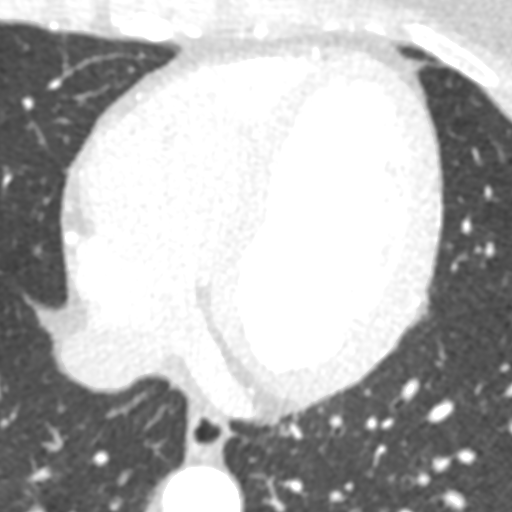
[im 237/356  vessel]
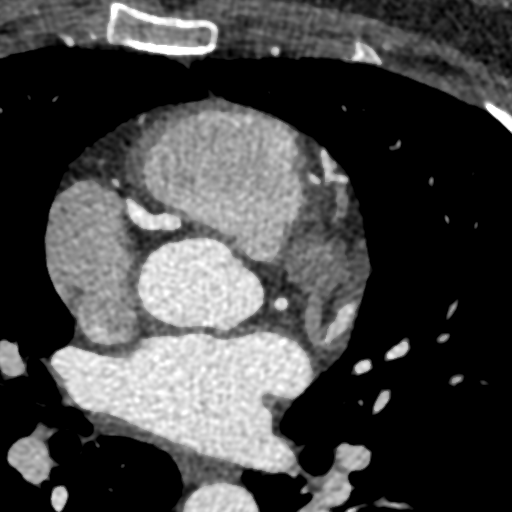

[Series 7: best syst 42 % · axial · 0.29mm/px · z∈[+1301,+1348]mm · 2 of 356 slices shown]
[im 119/356  vessel]
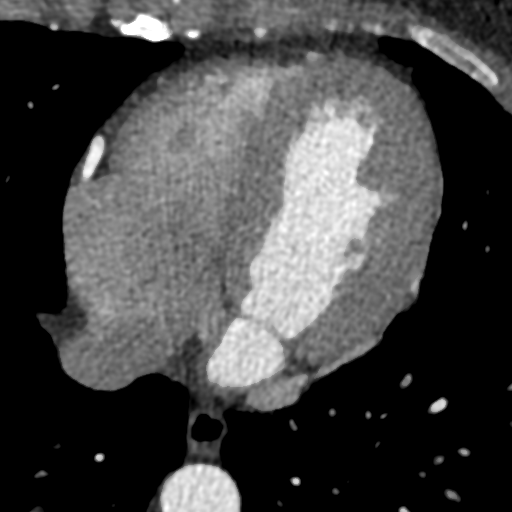
[im 237/356  vessel]
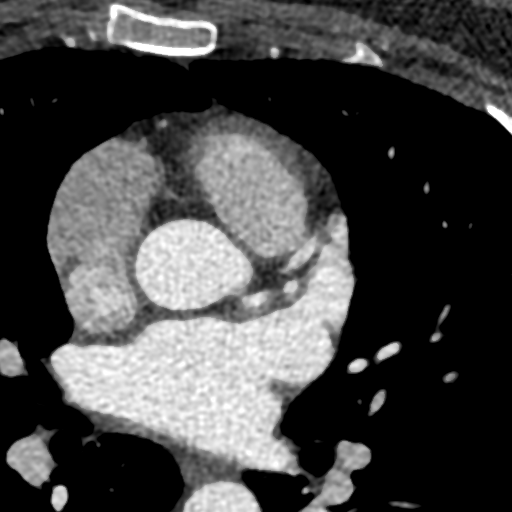

[Series 8: ts diast sharp 77 % · axial · 0.29mm/px · z∈[+1301,+1348]mm · 2 of 356 slices shown]
[im 119/356  lung]
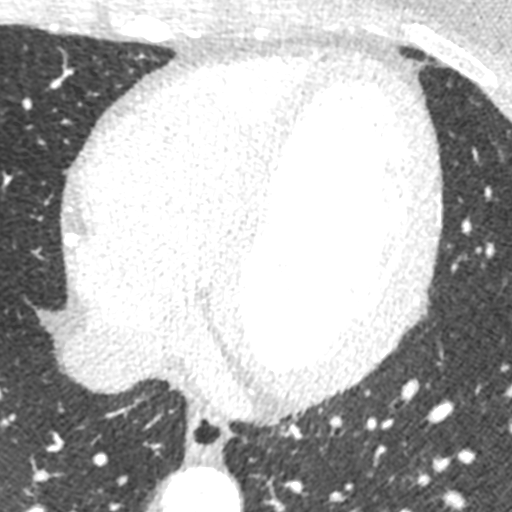
[im 237/356  lung]
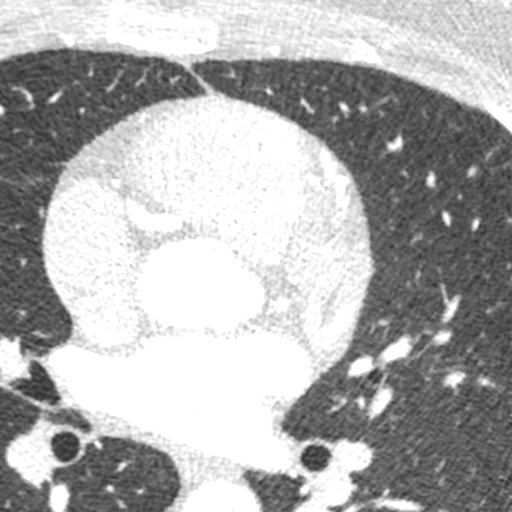

[Series 9: ts syst sharp 42 % · axial · 0.29mm/px · z∈[+1301,+1348]mm · 2 of 356 slices shown]
[im 119/356  lung]
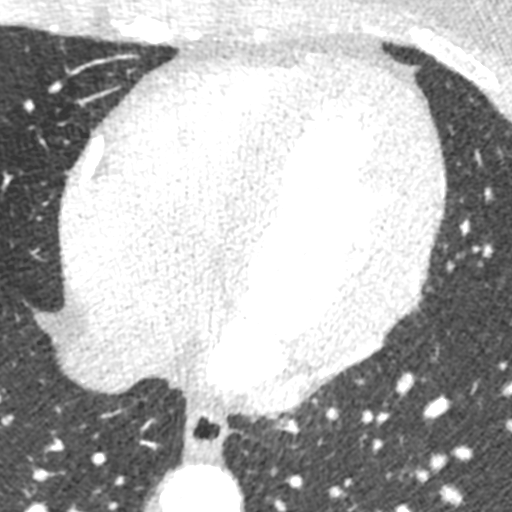
[im 237/356  lung]
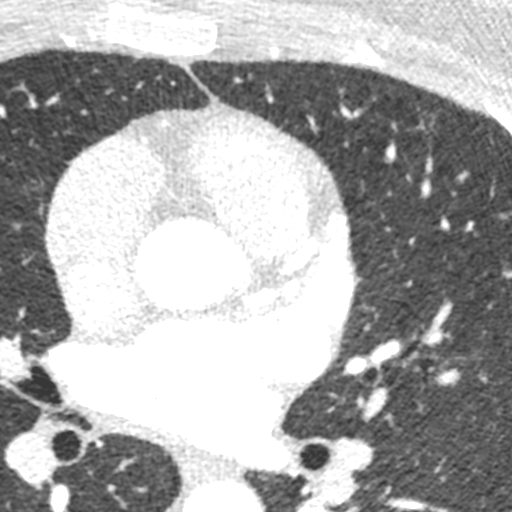

[8 of 20 positions shown; findings below may reference images not displayed]

FINDINGS: Limited view of the lung parenchyma demonstrates no suspicious
nodularity. Airways are normal.

Limited view of the mediastinum demonstrates no adenopathy.
Esophagus normal.

Limited view of the upper abdomen unremarkable.

Limited view of the skeleton and chest wall is unremarkable.
IMPRESSION: No significant extracardiac findings.
FINDINGS: Non-cardiac: See separate report from [REDACTED].

Pullmonary veins drain normally to the left atrium.

Calcium Score: 0 Agatston units.

Coronary Arteries: Right dominant with no anomalies

LM: No plaque or stenosis.

LAD system:  No plaque or stenosis.

Circumflex system: No plaque or stenosis.

RCA system:  No plaque or stenosis.
IMPRESSION: 1. Coronary artery calcium score 0 Agatston units, suggesting low
risk for future cardiac events.

2.  No significant coronary disease noted.

[place] P[place]es[place]

*** End of Addendum ***
EXAM:
OVER-READ INTERPRETATION  CT CHEST

The following report is an over-read performed by radiologist Dr.
Lizabeth Ken [REDACTED] on 08/28/2018. This
over-read does not include interpretation of cardiac or coronary
anatomy or pathology. The coronary CTA interpretation by the
cardiologist is attached.
FINDINGS: Limited view of the lung parenchyma demonstrates no suspicious
nodularity. Airways are normal.

Limited view of the mediastinum demonstrates no adenopathy.
Esophagus normal.

Limited view of the upper abdomen unremarkable.

Limited view of the skeleton and chest wall is unremarkable.
IMPRESSION: No significant extracardiac findings.

## 2021-09-17 ENCOUNTER — Other Ambulatory Visit: Payer: Self-pay | Admitting: Obstetrics and Gynecology

## 2021-09-17 DIAGNOSIS — Z1231 Encounter for screening mammogram for malignant neoplasm of breast: Secondary | ICD-10-CM

## 2021-09-21 ENCOUNTER — Ambulatory Visit
Admission: RE | Admit: 2021-09-21 | Discharge: 2021-09-21 | Disposition: A | Discharge status: Home / Self Care | Payer: 59 | Source: Ambulatory Visit | Attending: Obstetrics and Gynecology | Admitting: Obstetrics and Gynecology

## 2021-09-21 DIAGNOSIS — Z1231 Encounter for screening mammogram for malignant neoplasm of breast: Secondary | ICD-10-CM

## 2022-12-04 ENCOUNTER — Other Ambulatory Visit: Payer: Self-pay | Admitting: Obstetrics and Gynecology

## 2022-12-04 ENCOUNTER — Encounter: Payer: Self-pay | Admitting: Physician Assistant

## 2022-12-04 DIAGNOSIS — Z1231 Encounter for screening mammogram for malignant neoplasm of breast: Secondary | ICD-10-CM

## 2023-01-02 ENCOUNTER — Ambulatory Visit
Admission: RE | Admit: 2023-01-02 | Discharge: 2023-01-02 | Disposition: A | Discharge status: Home / Self Care | Payer: 59 | Source: Ambulatory Visit | Attending: Obstetrics and Gynecology | Admitting: Obstetrics and Gynecology

## 2023-01-02 DIAGNOSIS — Z1231 Encounter for screening mammogram for malignant neoplasm of breast: Secondary | ICD-10-CM

## 2023-09-23 DIAGNOSIS — Z01419 Encounter for gynecological examination (general) (routine) without abnormal findings: Secondary | ICD-10-CM | POA: Diagnosis not present

## 2023-09-23 DIAGNOSIS — Z124 Encounter for screening for malignant neoplasm of cervix: Secondary | ICD-10-CM | POA: Diagnosis not present

## 2023-11-04 DIAGNOSIS — B977 Papillomavirus as the cause of diseases classified elsewhere: Secondary | ICD-10-CM | POA: Diagnosis not present

## 2023-11-04 DIAGNOSIS — Z3202 Encounter for pregnancy test, result negative: Secondary | ICD-10-CM | POA: Diagnosis not present

## 2023-11-04 DIAGNOSIS — R87810 Cervical high risk human papillomavirus (HPV) DNA test positive: Secondary | ICD-10-CM | POA: Diagnosis not present

## 2023-11-04 DIAGNOSIS — N870 Mild cervical dysplasia: Secondary | ICD-10-CM | POA: Diagnosis not present

## 2023-11-04 DIAGNOSIS — N72 Inflammatory disease of cervix uteri: Secondary | ICD-10-CM | POA: Diagnosis not present

## 2023-11-11 DIAGNOSIS — E559 Vitamin D deficiency, unspecified: Secondary | ICD-10-CM | POA: Diagnosis not present

## 2023-11-11 DIAGNOSIS — E785 Hyperlipidemia, unspecified: Secondary | ICD-10-CM | POA: Diagnosis not present

## 2023-11-11 DIAGNOSIS — I10 Essential (primary) hypertension: Secondary | ICD-10-CM | POA: Diagnosis not present

## 2023-11-11 DIAGNOSIS — Z6833 Body mass index (BMI) 33.0-33.9, adult: Secondary | ICD-10-CM | POA: Diagnosis not present

## 2023-11-11 DIAGNOSIS — K219 Gastro-esophageal reflux disease without esophagitis: Secondary | ICD-10-CM | POA: Diagnosis not present
